# Patient Record
Sex: Female | Born: 1985 | Race: White | Hispanic: No | Marital: Single | Smoking: Never smoker
Health system: Southern US, Community
[De-identification: ages and names within clinical notes are randomized; demographics above are authoritative.]

## PROBLEM LIST (undated history)

## (undated) DIAGNOSIS — R0789 Other chest pain: Secondary | ICD-10-CM

## (undated) DIAGNOSIS — G43909 Migraine, unspecified, not intractable, without status migrainosus: Secondary | ICD-10-CM

## (undated) DIAGNOSIS — K219 Gastro-esophageal reflux disease without esophagitis: Secondary | ICD-10-CM

## (undated) HISTORY — DX: Migraine, unspecified, not intractable, without status migrainosus: G43.909

---

## 2010-06-15 ENCOUNTER — Ambulatory Visit: Payer: Self-pay | Admitting: Family Medicine

## 2011-04-16 ENCOUNTER — Ambulatory Visit: Payer: Self-pay

## 2011-08-07 ENCOUNTER — Emergency Department: Payer: Self-pay | Admitting: Emergency Medicine

## 2011-09-02 ENCOUNTER — Emergency Department: Payer: Self-pay | Admitting: Emergency Medicine

## 2012-03-18 ENCOUNTER — Ambulatory Visit: Payer: Self-pay | Admitting: Gastroenterology

## 2012-03-19 LAB — PATHOLOGY REPORT

## 2012-04-05 ENCOUNTER — Ambulatory Visit: Payer: Self-pay | Admitting: Internal Medicine

## 2012-08-17 ENCOUNTER — Ambulatory Visit: Payer: Self-pay | Admitting: Emergency Medicine

## 2012-08-17 LAB — COMPREHENSIVE METABOLIC PANEL
Albumin: 3.8 g/dL (ref 3.4–5.0)
Alkaline Phosphatase: 67 U/L (ref 50–136)
Anion Gap: 12 (ref 7–16)
BUN: 14 mg/dL (ref 7–18)
Bilirubin,Total: 0.5 mg/dL (ref 0.2–1.0)
Calcium, Total: 9 mg/dL (ref 8.5–10.1)
Chloride: 107 mmol/L (ref 98–107)
Co2: 21 mmol/L (ref 21–32)
Creatinine: 0.74 mg/dL (ref 0.60–1.30)
EGFR (African American): >60
EGFR (Non-African Amer.): >60
Glucose: 97 mg/dL (ref 65–99)
Osmolality: 280 (ref 275–301)
Potassium: 3.9 mmol/L (ref 3.5–5.1)
SGOT(AST): 21 U/L (ref 15–37)
SGPT (ALT): 20 U/L (ref 12–78)
Sodium: 140 mmol/L (ref 136–145)
Total Protein: 7.4 g/dL (ref 6.4–8.2)

## 2012-08-17 LAB — CBC WITH DIFFERENTIAL/PLATELET
Basophil #: 0.1 10*3/uL (ref 0.0–0.1)
Basophil %: 1.1 %
Eosinophil #: 0.1 10*3/uL (ref 0.0–0.7)
Eosinophil %: 1.7 %
HCT: 37.2 % (ref 35.0–47.0)
HGB: 12.6 g/dL (ref 12.0–16.0)
Lymphocyte #: 2.2 10*3/uL (ref 1.0–3.6)
Lymphocyte %: 35.8 %
MCH: 29.3 pg (ref 26.0–34.0)
MCHC: 33.8 g/dL (ref 32.0–36.0)
MCV: 87 fL (ref 80–100)
Monocyte #: 0.5 x10 3/mm (ref 0.2–0.9)
Monocyte %: 8 %
Neutrophil #: 3.2 10*3/uL (ref 1.4–6.5)
Neutrophil %: 53.4 %
Platelet: 303 10*3/uL (ref 150–440)
RBC: 4.3 10*6/uL (ref 3.80–5.20)
RDW: 12.8 % (ref 11.5–14.5)
WBC: 6.1 10*3/uL (ref 3.6–11.0)

## 2012-08-27 ENCOUNTER — Ambulatory Visit: Payer: Self-pay | Admitting: Medical

## 2012-08-27 LAB — PREGNANCY, URINE: Pregnancy Test, Urine: NEGATIVE m[IU]/mL

## 2012-12-05 ENCOUNTER — Ambulatory Visit: Payer: Self-pay | Admitting: Family Medicine

## 2012-12-05 LAB — URINALYSIS, COMPLETE
Glucose,UR: NEGATIVE mg/dL (ref 0–75)
Ph: 6 (ref 4.5–8.0)
Specific Gravity: >=1.03 (ref 1.003–1.030)

## 2013-08-09 ENCOUNTER — Encounter: Payer: Self-pay | Admitting: Podiatry

## 2013-08-09 ENCOUNTER — Ambulatory Visit (INDEPENDENT_AMBULATORY_CARE_PROVIDER_SITE_OTHER): Payer: BC Managed Care – PPO | Admitting: Podiatry

## 2013-08-09 VITALS — BP 124/68 | HR 82 | Resp 16 | Ht 64.0 in | Wt 276.0 lb

## 2013-08-09 DIAGNOSIS — M722 Plantar fascial fibromatosis: Secondary | ICD-10-CM

## 2013-08-09 MED ORDER — METHYLPREDNISOLONE (PAK) 4 MG PO TABS: ORAL_TABLET | ORAL | Status: DC

## 2013-08-09 MED ORDER — DICLOFENAC SODIUM 75 MG PO TBEC: 75.0000 mg | DELAYED_RELEASE_TABLET | Freq: Two times a day (BID) | ORAL | Status: DC

## 2013-08-09 NOTE — Progress Notes (Signed)
N PAIN L RIGHT HEEL/FOOT D 2-3 WEEKS O SLOWLY C SAME A STANDING T EXERCISES, NITE SPLINT

## 2013-08-09 NOTE — Progress Notes (Signed)
Emily Norris is a 27 year old white female with a chief complaint of pain over the last 2-3 weeks of a right heel slowly getting worse standing really agitate set and she's currently working in retail.  Objective: I have reviewed her past medical history medications and allergies. Pulses are strongly palpable bilateral lower extremity. She has severe pain on palpation medial continued tubercle of the right heel.  Assessment: Plantar fasciitis right.  Plan: Medrol Dosepak to be followed by Mobic injected the right heel today dispensed a night splint discussed appropriate shoe gear stretching exercises and ice therapy instructions were given followup with her one month.

## 2013-09-02 HISTORY — PX: PLANTAR FASCIA SURGERY: SHX746

## 2013-09-06 ENCOUNTER — Encounter: Payer: Self-pay | Admitting: Podiatry

## 2013-09-06 ENCOUNTER — Ambulatory Visit (INDEPENDENT_AMBULATORY_CARE_PROVIDER_SITE_OTHER): Payer: BC Managed Care – PPO | Admitting: Podiatry

## 2013-09-06 VITALS — BP 118/90 | HR 79 | Resp 16 | Ht 64.0 in | Wt 286.0 lb

## 2013-09-06 DIAGNOSIS — M722 Plantar fascial fibromatosis: Secondary | ICD-10-CM

## 2013-09-06 MED ORDER — IBUPROFEN 600 MG PO TABS: 600.0000 mg | ORAL_TABLET | Freq: Three times a day (TID) | ORAL | Status: DC

## 2013-09-06 NOTE — Progress Notes (Signed)
She states that the right heel is approximately 50% better. She was unable to take the Mobic due to stomach upset.  Objective: Vital signs are stable she is alert and oriented x3. She has mild tenderness on palpation of the medial calcaneal tubercle of the right foot. It is not warm to the touch.  Assessment: Plantar fasciitis.  Plan: At this point we are going to continue conservative therapies. I wrote a prescription for Motrin 600 mg 1 by mouth 3 times a day with food. She's going continue all other conservative therapies and I will followup with her in one month should she need me. Orthotics weeks and at that time if necessary

## 2013-10-04 ENCOUNTER — Ambulatory Visit (INDEPENDENT_AMBULATORY_CARE_PROVIDER_SITE_OTHER): Payer: BC Managed Care – PPO | Admitting: Podiatry

## 2013-10-04 ENCOUNTER — Encounter: Payer: Self-pay | Admitting: Podiatry

## 2013-10-04 VITALS — BP 80/49 | HR 75 | Resp 16

## 2013-10-04 DIAGNOSIS — M722 Plantar fascial fibromatosis: Secondary | ICD-10-CM

## 2013-10-04 NOTE — Progress Notes (Signed)
Right heel , i think its actually worse.  The ibuprofen is working . It does not mess up my stomach.  Objective: Vital signs are stable she is alert and oriented x3. She has pain on palpation medial continued tubercle of her right heel.  Assessment: Plantar fasciitis right.  Plan: She was scanned for a pair orthotics today. I reinjected the right heel with Kenalog and dexamethasone. I will followup with her once her orthotics come in. She will continue all other conservative therapies.

## 2013-10-15 ENCOUNTER — Encounter: Payer: Self-pay | Admitting: Podiatry

## 2013-10-15 NOTE — Progress Notes (Signed)
SENT PT POSTCARD LETTING HER KNOW ORTHOTICS ARE HERE. 

## 2013-11-16 IMAGING — CT CT HEAD WITHOUT CONTRAST
1 series · 16 of 30 positions shown, 20 images · non-contrast
Comparison: none

REASON FOR EXAM: Call Report [id number] Dizziness Rt Side Numbness
COMMENTS:

PROCEDURE:     DIGNA - MESFIN GALEANA WITHOUT CONTRAST  - August 27, 2012  [DATE]
RESULT:     Comparison:  None
TECHNIQUE: Multiple axial images from the foramen magnum to the vertex were
obtained without IV contrast.

[Series 2: soft tissue · axial · 0.41mm/px · z∈[-125,+10]mm · 16 of 31 slices shown, 20 images]
[im 2/31  brain]
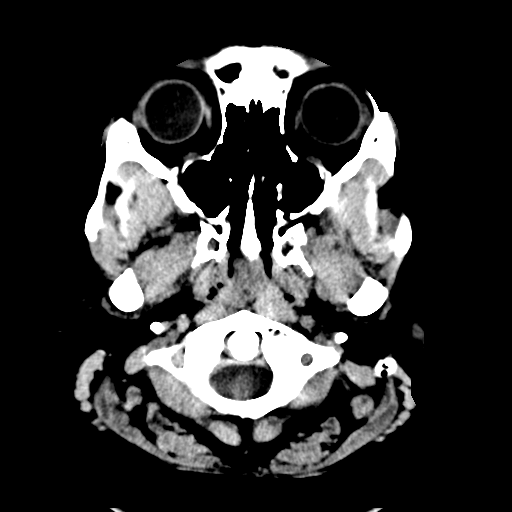
[im 2/31  bone]
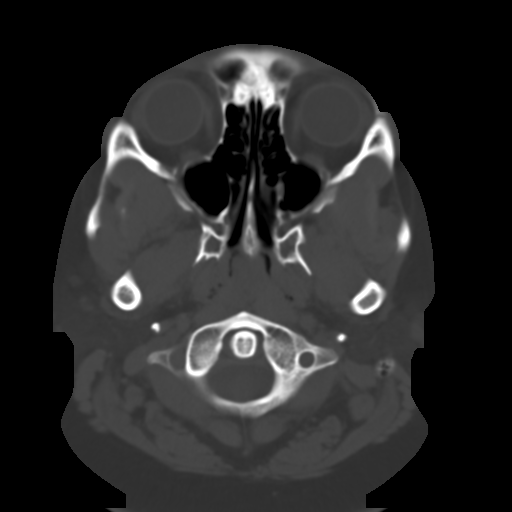
[im 4/31  brain]
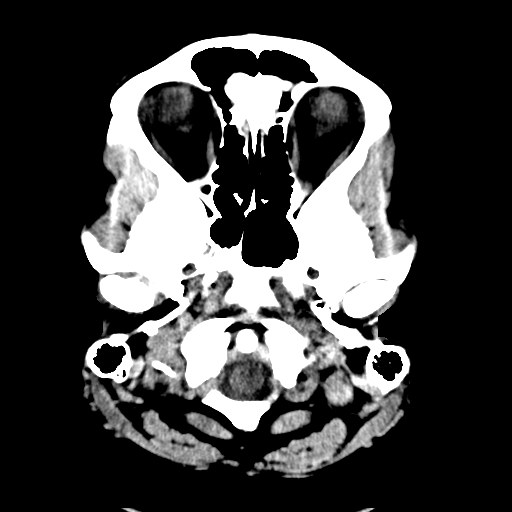
[im 6/31  brain]
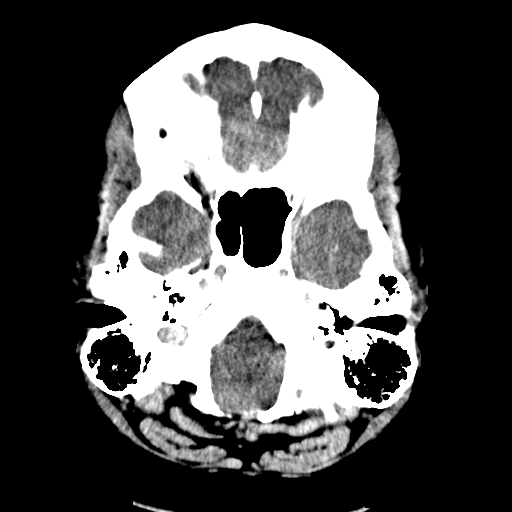
[im 8/31  brain]
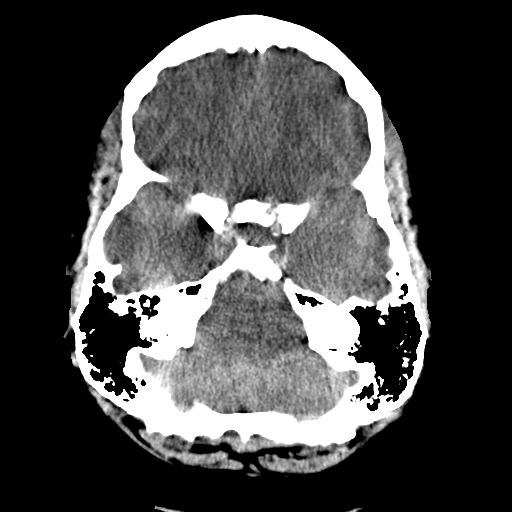
[im 9/31  brain]
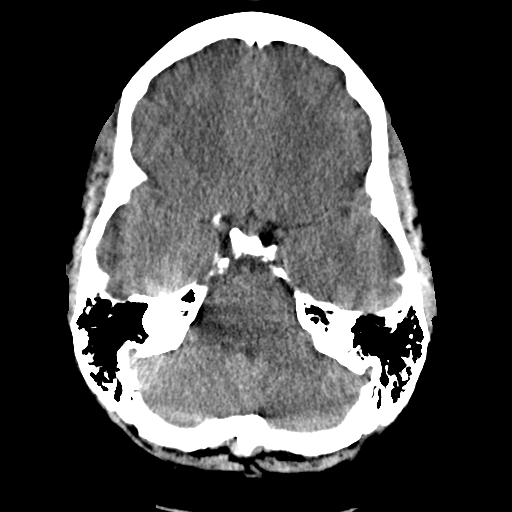
[im 9/31  bone]
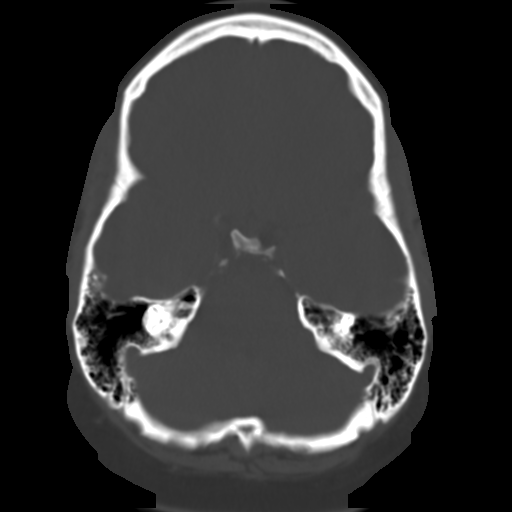
[im 11/31  brain]
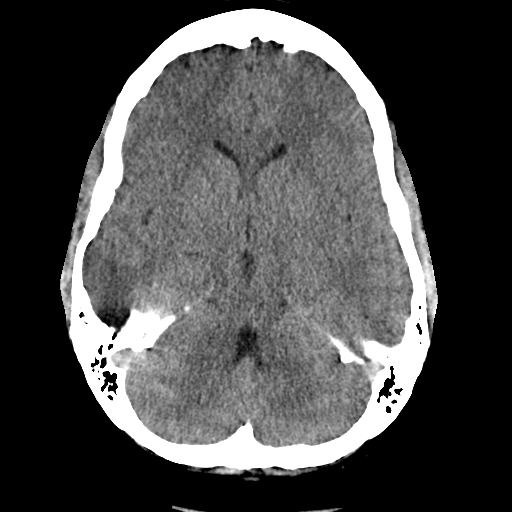
[im 13/31  brain]
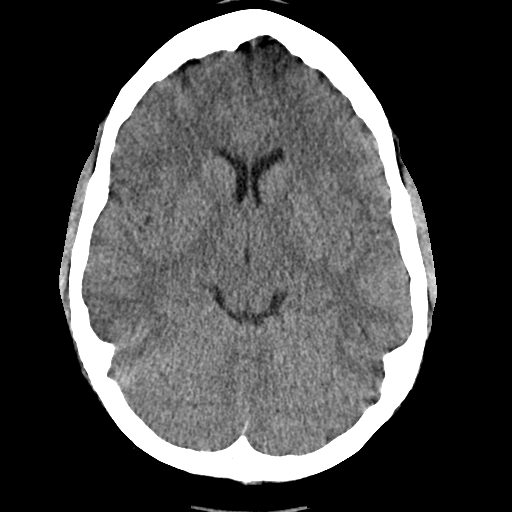
[im 15/31  brain]
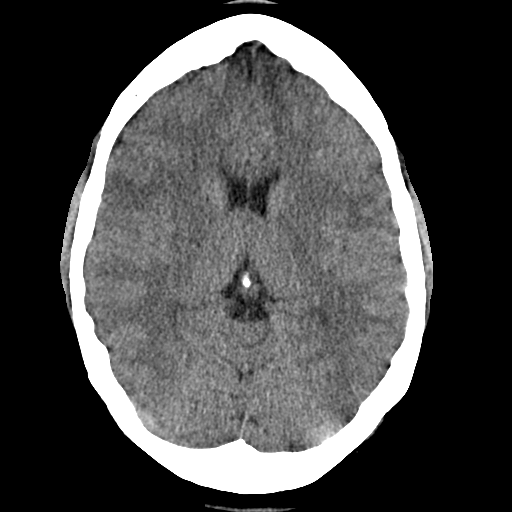
[im 16/31  brain]
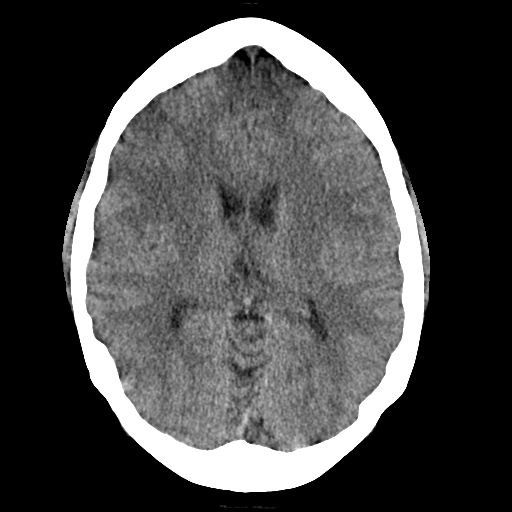
[im 16/31  bone]
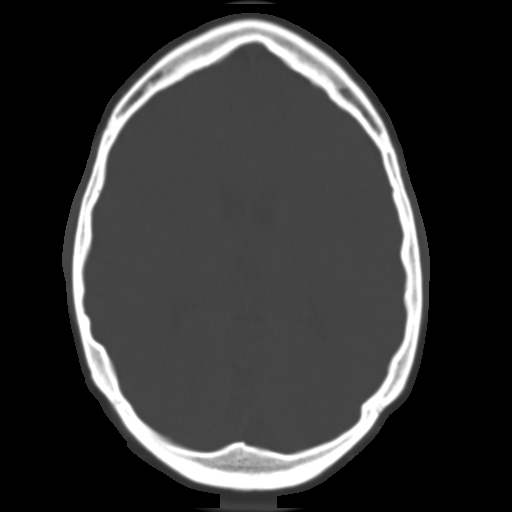
[im 18/31  brain]
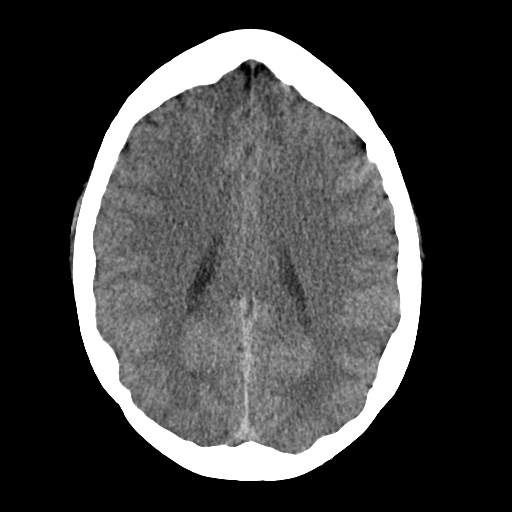
[im 20/31  brain]
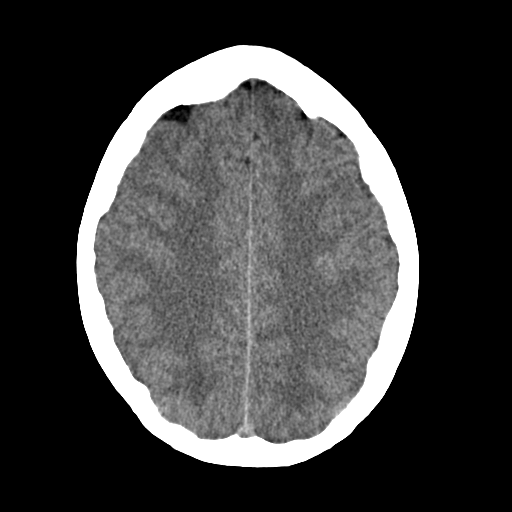
[im 22/31  brain]
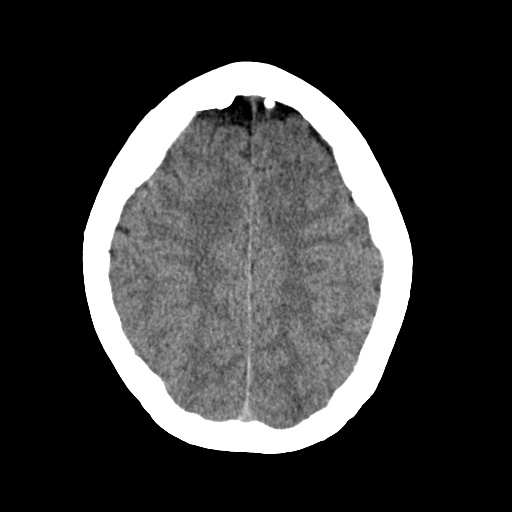
[im 23/31  brain]
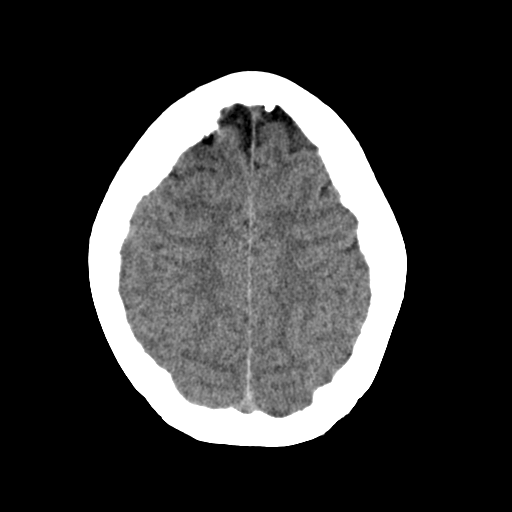
[im 23/31  bone]
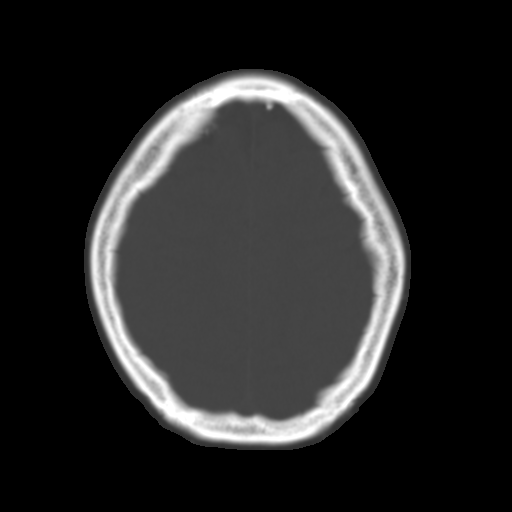
[im 25/31  brain]
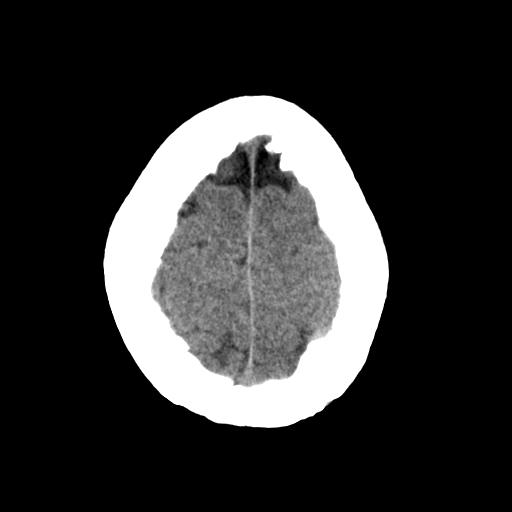
[im 27/31  brain]
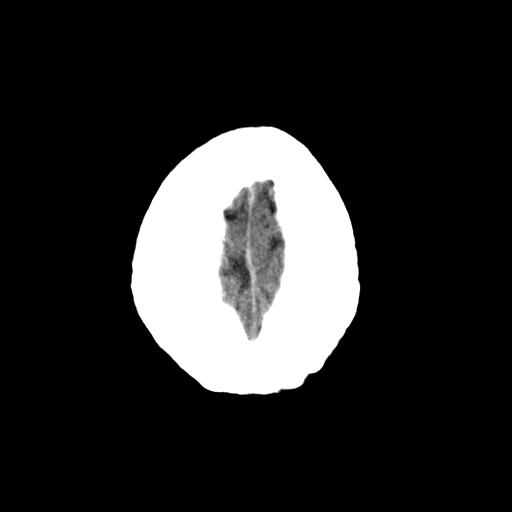
[im 29/31  brain]
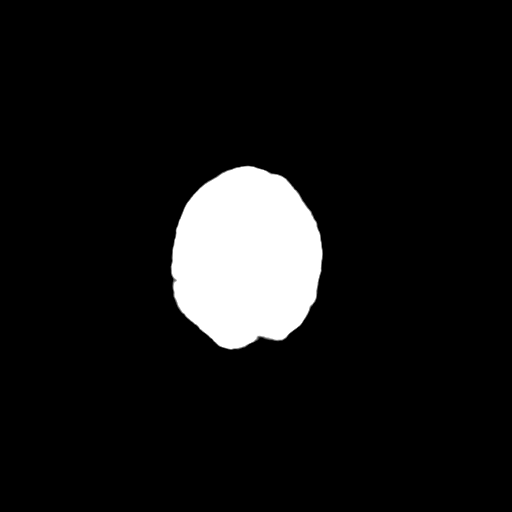

[16 of 30 positions shown; findings below may reference images not displayed]

FINDINGS: There is no evidence of mass effect, midline shift, or extra-axial fluid
collections.  There is no evidence of a space-occupying lesion or
intracranial hemorrhage. There is no evidence of a cortical-based area of
acute infarction.

The ventricles and sulci are appropriate for the patient's age. The basal
cisterns are patent.

Visualized portions of the orbits are unremarkable. The visualized portions
of the paranasal sinuses and mastoid air cells are unremarkable.

The osseous structures are unremarkable.
IMPRESSION: No acute intracranial process.

[REDACTED]

## 2013-12-20 ENCOUNTER — Ambulatory Visit (INDEPENDENT_AMBULATORY_CARE_PROVIDER_SITE_OTHER): Payer: BC Managed Care – PPO | Admitting: Podiatry

## 2013-12-20 VITALS — Resp 16 | Ht 64.0 in | Wt 290.0 lb

## 2013-12-20 DIAGNOSIS — M722 Plantar fascial fibromatosis: Secondary | ICD-10-CM

## 2013-12-20 NOTE — Progress Notes (Signed)
She presents today to pick up orthotics.  Objective: Vital signs are stable she is alert and oriented x3. She has pain on palpation medial calcaneal tubercle of the right heel which appears to be getting worse rather than better.  Assessment: Plantar fasciitis of the right heel.  Plan: Injected the right heel once again today which I would normally not have done with exception that she is getting up her orthotics. She will continue her oral anti-inflammatories and I will followup with her in 3-4 weeks. An MRI may become necessary.

## 2013-12-20 NOTE — Patient Instructions (Signed)

## 2013-12-22 ENCOUNTER — Encounter: Payer: Self-pay | Admitting: Podiatry

## 2014-01-17 ENCOUNTER — Ambulatory Visit: Payer: BC Managed Care – PPO | Admitting: Podiatry

## 2014-02-14 ENCOUNTER — Encounter: Payer: Self-pay | Admitting: Podiatry

## 2014-02-14 ENCOUNTER — Ambulatory Visit (INDEPENDENT_AMBULATORY_CARE_PROVIDER_SITE_OTHER): Payer: BC Managed Care – PPO | Admitting: Podiatry

## 2014-02-14 DIAGNOSIS — M722 Plantar fascial fibromatosis: Secondary | ICD-10-CM

## 2014-02-14 NOTE — Progress Notes (Signed)
She presents today one month after having pick up orthotics complaining that orthotics were only for about 3 weeks and her foot pain come back. She has pain right ear a she points to the medial aspect of the right foot.  Objective: Pulses are strongly palpable bilateral. She has pain on palpation medial calcaneal tubercle of the right heel.  Assessment: Plantar fasciitis x1 year right foot.  Plan: At this point conservative therapies have failed surgical therapy must be considered. We consented her today for a endoscopic plantar fasciotomy of the right heel. We will over the consent form today line bylined number by number giving her ample time to ask questions she saw fit regarding these procedures I answered them to the best of my ability in layman's terms she understood it was amenable to it and signed all 3 pages of the consent form. We did discuss a possible postop complications which may include but are not limited to postop pain bleeding swelling infection recurrence need for further surgery. She understands that is amenable to it. She was dispensed a Cam Walker and I will followup with her in a few weeks for surgery.

## 2014-02-24 ENCOUNTER — Other Ambulatory Visit: Payer: Self-pay | Admitting: Podiatry

## 2014-02-24 MED ORDER — CLINDAMYCIN HCL 150 MG PO CAPS: 150.0000 mg | ORAL_CAPSULE | Freq: Three times a day (TID) | ORAL | Status: DC

## 2014-02-24 MED ORDER — PROMETHAZINE HCL 25 MG PO TABS: 25.0000 mg | ORAL_TABLET | Freq: Three times a day (TID) | ORAL | Status: DC | PRN

## 2014-02-24 MED ORDER — OXYCODONE-ACETAMINOPHEN 10-325 MG PO TABS: 1.0000 | ORAL_TABLET | Freq: Four times a day (QID) | ORAL | Status: DC | PRN

## 2014-02-25 ENCOUNTER — Encounter: Payer: Self-pay | Admitting: Podiatry

## 2014-02-25 DIAGNOSIS — M722 Plantar fascial fibromatosis: Secondary | ICD-10-CM

## 2014-02-28 ENCOUNTER — Telehealth: Payer: Self-pay | Admitting: *Deleted

## 2014-02-28 NOTE — Telephone Encounter (Signed)
1. ENDOSCOPIC PLANTAR FASCIOTOMY RIGHT FOOT  DOB [birth year]

## 2014-02-28 NOTE — Telephone Encounter (Signed)
CALLED AND SPOKE WITH PT REGARDING SURGERY ON 6.26.15. PT STATES SHE IS ELEVATING, STAYING OFF OF FOOT, ICING, AND TAKING RX AS DIRECTED. VERIFIED PTS NEXT APPT. PT UNDERSTOOD.

## 2014-03-01 ENCOUNTER — Telehealth: Payer: Self-pay

## 2014-03-01 NOTE — Telephone Encounter (Signed)
Left message for pt to call with questions or concerns  

## 2014-03-03 ENCOUNTER — Ambulatory Visit (INDEPENDENT_AMBULATORY_CARE_PROVIDER_SITE_OTHER): Payer: BC Managed Care – PPO | Admitting: Podiatry

## 2014-03-03 VITALS — BP 116/80 | HR 72 | Temp 98.6°F | Resp 16

## 2014-03-03 DIAGNOSIS — Z9889 Other specified postprocedural states: Secondary | ICD-10-CM

## 2014-03-03 DIAGNOSIS — M722 Plantar fascial fibromatosis: Secondary | ICD-10-CM

## 2014-03-03 NOTE — Progress Notes (Signed)
She presents today one week status post endoscopic plantar fasciotomy right foot. She states that she's doing just fine. She states it is a little sore but nothing like he was in she continues to wear her Cam Dan HumphreysWalker a regular basis. She states that she had had it off some to ice the bottom part of her foot.  Objective: Vital signs are stable she is alert and oriented x3. Pulses are strongly palpable right foot. Dry sterile dressing had been removed. There is no erythema edema saline is drainage or odor. Sutures are intact medially and laterally.  Assessment: Status post endoscopic plantar fasciotomy x1 week.  Plan: Light dressing to the incision site the day and would allow her to start getting this wet and soaking in Epsom salts warm water daily. I will followup with her in one week. She's to continue the use 24 hours of the Cam Walker.

## 2014-03-10 ENCOUNTER — Encounter: Payer: Self-pay | Admitting: Podiatry

## 2014-03-10 ENCOUNTER — Ambulatory Visit (INDEPENDENT_AMBULATORY_CARE_PROVIDER_SITE_OTHER): Payer: BC Managed Care – PPO | Admitting: Podiatry

## 2014-03-10 VITALS — BP 90/52 | HR 67 | Temp 98.8°F | Resp 16

## 2014-03-10 DIAGNOSIS — Z9889 Other specified postprocedural states: Secondary | ICD-10-CM

## 2014-03-10 NOTE — Progress Notes (Signed)
She presents today 2 weeks status post endoscopic plantar fasciotomy right heel. She states that the first week was doing great the second week was excruciatingly painful along the mid arch. The heel is still doing quite well. She states that she can hardly stand on her foot for very long period of time at all.  Objective: Vital signs are stable she is alert and oriented x3. She presents her Cam Dan HumphreysWalker today with an antalgic gait. Sutures were removed. She has some tenderness on deep palpation along the medial longitudinal arch but no tenderness on palpation of the side of the fasciotomy.  Assessment: Marginal setback with stress of the intrinsic musculature to the plantar aspect of the right foot. Status post endoscopic plantar fasciotomy right foot.  Plan: Discontinue the use of the Cam Walker during the day start with a pair tennis shoes. She will continue the use of the night splint x1 month. I will followup with her in 2 weeks. We will continue to keep her out of work for the next week or 2.

## 2014-03-14 DIAGNOSIS — M79609 Pain in unspecified limb: Secondary | ICD-10-CM

## 2014-03-17 ENCOUNTER — Encounter: Payer: Self-pay | Admitting: Podiatry

## 2014-03-17 ENCOUNTER — Ambulatory Visit (INDEPENDENT_AMBULATORY_CARE_PROVIDER_SITE_OTHER): Payer: BC Managed Care – PPO | Admitting: Podiatry

## 2014-03-17 VITALS — BP 138/86 | HR 84 | Resp 12

## 2014-03-17 DIAGNOSIS — Z9889 Other specified postprocedural states: Secondary | ICD-10-CM

## 2014-03-17 NOTE — Progress Notes (Signed)
She presents today 3 weeks status post EPF right foot. She states is doing much better.  Objective: Vital signs are stable she is alert and oriented x3. No pain on palpation to the medial arch or the medial heel right.  Assessment: Well-healing surgical foot right.  Plan: Tinea all conservative therapies followup with me in 2-3 weeks.

## 2014-03-23 ENCOUNTER — Telehealth: Payer: Self-pay | Admitting: *Deleted

## 2014-03-23 NOTE — Telephone Encounter (Signed)
Pt called stating that she is in a lot of pain , told patient she can use tylenol , or ibuprofen or aleve. Keep it elevated, ice and compression and if needed to go back to her boot . Call us with any further questions

## 2014-03-25 ENCOUNTER — Encounter: Payer: Self-pay | Admitting: Podiatry

## 2014-03-25 ENCOUNTER — Ambulatory Visit (INDEPENDENT_AMBULATORY_CARE_PROVIDER_SITE_OTHER): Payer: BC Managed Care – PPO

## 2014-03-25 ENCOUNTER — Ambulatory Visit (INDEPENDENT_AMBULATORY_CARE_PROVIDER_SITE_OTHER): Payer: BC Managed Care – PPO | Admitting: Podiatry

## 2014-03-25 DIAGNOSIS — M722 Plantar fascial fibromatosis: Secondary | ICD-10-CM

## 2014-03-25 DIAGNOSIS — M7750 Other enthesopathy of unspecified foot: Secondary | ICD-10-CM

## 2014-03-25 MED ORDER — TRAMADOL HCL 50 MG PO TABS: 50.0000 mg | ORAL_TABLET | Freq: Three times a day (TID) | ORAL | Status: DC

## 2014-03-26 NOTE — Progress Notes (Signed)
Subjective:     Patient ID: Emily Norris, female   DOB: 06/12/1986, [age] y.o.   MRN: [id number]  HPI patient had surgery on her heel one month ago went back to work and is developed significant discomfort in the right arch and mild pain in the heel was swelling   Review of Systems     Objective:   Physical Exam Neurovascular status intact with muscle strength adequate range of motion within normal limits and quite a bit of discomfort in the right arch with a well-healed surgical scar on the right medial and lateral heel    Assessment:     Probably has overdone it on cement floors and created an inflammatory process with pain within the arch secondary to heel release of the plantar fascia    Plan:     Explained condition the patient and dispensed fasciall brace with instructions on usage. I also placed her on tramadol 50 mg 3 times a day and we will take her off work and Dr. Al CorpusHyatt we'll reevaluate. X-ray taken today indicated no midfoot forefoot pathology and that was reviewed with patient

## 2014-04-07 ENCOUNTER — Ambulatory Visit (INDEPENDENT_AMBULATORY_CARE_PROVIDER_SITE_OTHER): Payer: BC Managed Care – PPO | Admitting: Podiatry

## 2014-04-07 VITALS — BP 128/74 | HR 75 | Resp 16

## 2014-04-07 DIAGNOSIS — Z9889 Other specified postprocedural states: Secondary | ICD-10-CM

## 2014-04-07 DIAGNOSIS — M722 Plantar fascial fibromatosis: Secondary | ICD-10-CM

## 2014-04-07 NOTE — Progress Notes (Signed)
She presents today for followup of an endoscopic plantar fasciotomy right foot. She states it seems to be improving daily. It bothers her sometimes while she is working so she wears the boot occasionally at work.  Objective: Vital signs are stable she is alert and oriented x3 date of surgery was 02/25/2014. She has no pain on palpation medial continued tubercle of the right foot though I am able to palpate the scar from the endoscope.  Assessment: Slowly healing plantar fasciitis status post endoscopic plantar fasciotomy right foot.  Plan: Encouraged her to continue to massage the plantar aspect of the right foot. I will followup with her in one month if necessary.

## 2014-05-05 ENCOUNTER — Encounter: Payer: BC Managed Care – PPO | Admitting: Podiatry

## 2014-05-12 ENCOUNTER — Ambulatory Visit (INDEPENDENT_AMBULATORY_CARE_PROVIDER_SITE_OTHER): Payer: BC Managed Care – PPO | Admitting: Podiatry

## 2014-05-12 VITALS — BP 127/85 | HR 68 | Resp 16

## 2014-05-12 DIAGNOSIS — Z9889 Other specified postprocedural states: Secondary | ICD-10-CM

## 2014-05-12 NOTE — Progress Notes (Signed)
She presents today for followup of her endoscopic plantar fasciotomy right foot date of surgery was 02/25/2014. She states that the surgical site is feeling much better and heel is greatly improved. Occasionally she can feel some material rolling underneath her heel it is really not that sore.  Objective: Vital signs are stable she is alert and oriented x3. She has no reproducible pain on palpation today. No erythema edema cellulitis drainage or odor. I am unable to palpate the medial band of the plantar fascia.  Assessment: Well-healing endoscopic plantar fasciotomy of the right foot.  Plan: Continue all conservative therapies and I will followup with her as needed.

## 2015-03-15 DIAGNOSIS — F419 Anxiety disorder, unspecified: Secondary | ICD-10-CM | POA: Insufficient documentation

## 2015-09-21 DIAGNOSIS — R200 Anesthesia of skin: Secondary | ICD-10-CM | POA: Insufficient documentation

## 2015-09-21 DIAGNOSIS — R202 Paresthesia of skin: Secondary | ICD-10-CM | POA: Insufficient documentation

## 2015-09-29 DIAGNOSIS — G5603 Carpal tunnel syndrome, bilateral upper limbs: Secondary | ICD-10-CM | POA: Insufficient documentation

## 2016-08-02 ENCOUNTER — Ambulatory Visit
Admission: RE | Admit: 2016-08-02 | Discharge: 2016-08-02 | Disposition: A | Discharge status: Home / Self Care | Payer: BLUE CROSS/BLUE SHIELD | Source: Ambulatory Visit | Attending: Medical Oncology | Admitting: Medical Oncology

## 2016-08-02 ENCOUNTER — Other Ambulatory Visit: Payer: Self-pay | Admitting: Medical Oncology

## 2016-08-02 DIAGNOSIS — Z789 Other specified health status: Secondary | ICD-10-CM

## 2016-08-02 DIAGNOSIS — Z7289 Other problems related to lifestyle: Secondary | ICD-10-CM

## 2016-08-02 DIAGNOSIS — R05 Cough: Secondary | ICD-10-CM | POA: Insufficient documentation

## 2016-08-02 DIAGNOSIS — R058 Other specified cough: Secondary | ICD-10-CM

## 2016-08-02 DIAGNOSIS — R509 Fever, unspecified: Secondary | ICD-10-CM

## 2016-08-02 DIAGNOSIS — Z720 Tobacco use: Secondary | ICD-10-CM | POA: Insufficient documentation

## 2016-08-09 ENCOUNTER — Other Ambulatory Visit: Payer: Self-pay | Admitting: Family Medicine

## 2016-08-09 DIAGNOSIS — M545 Low back pain: Secondary | ICD-10-CM

## 2017-02-26 DIAGNOSIS — R0789 Other chest pain: Secondary | ICD-10-CM

## 2017-02-26 HISTORY — DX: Other chest pain: R07.89

## 2017-02-27 ENCOUNTER — Encounter: Payer: Self-pay | Admitting: *Deleted

## 2017-02-27 ENCOUNTER — Encounter
Admission: RE | Admit: 2017-02-27 | Discharge: 2017-02-27 | Disposition: A | Discharge status: Home / Self Care | Payer: BLUE CROSS/BLUE SHIELD | Source: Ambulatory Visit | Attending: Orthopedic Surgery | Admitting: Orthopedic Surgery

## 2017-02-27 DIAGNOSIS — R0789 Other chest pain: Secondary | ICD-10-CM | POA: Insufficient documentation

## 2017-02-27 DIAGNOSIS — G5603 Carpal tunnel syndrome, bilateral upper limbs: Secondary | ICD-10-CM | POA: Diagnosis not present

## 2017-02-27 HISTORY — DX: Gastro-esophageal reflux disease without esophagitis: K21.9

## 2017-02-27 HISTORY — DX: Morbid (severe) obesity due to excess calories: E66.01

## 2017-02-27 HISTORY — DX: Other chest pain: R07.89

## 2017-02-27 LAB — BASIC METABOLIC PANEL
ANION GAP: 8 (ref 5–15)
BUN: 12 mg/dL (ref 6–20)
CHLORIDE: 108 mmol/L (ref 101–111)
CO2: 21 mmol/L — ABNORMAL LOW (ref 22–32)
Calcium: 9.2 mg/dL (ref 8.9–10.3)
Creatinine, Ser: 0.79 mg/dL (ref 0.44–1.00)
GFR calc Af Amer: >60 mL/min (ref >60)
GFR calc non Af Amer: >60 mL/min (ref >60)
Glucose, Bld: 123 mg/dL — ABNORMAL HIGH (ref 65–99)
POTASSIUM: 3.6 mmol/L (ref 3.5–5.1)
SODIUM: 137 mmol/L (ref 135–145)

## 2017-02-27 LAB — CBC
HCT: 37.5 % (ref 35.0–47.0)
HEMOGLOBIN: 12.9 g/dL (ref 12.0–16.0)
MCH: 28.9 pg (ref 26.0–34.0)
MCHC: 34.4 g/dL (ref 32.0–36.0)
MCV: 84 fL (ref 80.0–100.0)
PLATELETS: 415 10*3/uL (ref 150–440)
RBC: 4.47 MIL/uL (ref 3.80–5.20)
RDW: 13.2 % (ref 11.5–14.5)
WBC: 8.2 10*3/uL (ref 3.6–11.0)

## 2017-02-27 NOTE — Patient Instructions (Signed)
  Your procedure is scheduled on: 03-06-17 THURSDAY Report to Same Day Surgery 2nd floor medical mall [facility](Medical Mall Entrance-take elevator on left to 2nd floor.  Check in with surgery information desk.) To find out your arrival time please call [phone] between 1PM - 3PM on 03-04-17 TUESDAY  Remember: Instructions that are not followed completely may result in serious medical risk, up to and including death, or upon the discretion of your surgeon and anesthesiologist your surgery may need to be rescheduled.    _x___ 1. Do not eat food or drink liquids after midnight. No gum chewing or hard candies.     __x__ 2. No Alcohol for 24 hours before or after surgery.   __x__3. No Smoking for 24 prior to surgery.   ____  4. Bring all medications with you on the day of surgery if instructed.    __x__ 5. Notify your doctor if there is any change in your medical condition     (cold, fever, infections).     Do not wear jewelry, make-up, hairpins, clips or nail polish.  Do not wear lotions, powders, or perfumes. You may wear deodorant.  Do not shave 48 hours prior to surgery. Men may shave face and neck.  Do not bring valuables to the hospital.    [facility] is not responsible for any belongings or valuables.               Contacts, dentures or bridgework may not be worn into surgery.  Leave your suitcase in the car. After surgery it may be brought to your room.  For patients admitted to the hospital, discharge time is determined by your treatment team.   Patients discharged the day of surgery will not be allowed to drive home.  You will need someone to drive you home and stay with you the night of your procedure.    Please read over the following fact sheets that you were given:     _x___ TAKE THE FOLLOWING MEDICATIONS THE MORNING OF SURGERY WITH A SMALL SIP OF WATER. These include:  1. ALLEGRA  2. WELLBUTRIN  3. CAMRESE  4.  5.  6.  ____Fleets enema or Magnesium Citrate as  directed.   _x___ Use CHG Soap or sage wipes as directed on instruction sheet   ____ Use inhalers on the day of surgery and bring to hospital day of surgery  ____ Stop Metformin and Janumet 2 days prior to surgery.    ____ Take 1/2 of usual insulin dose the night before surgery and none on the morning surgery.   ____ Follow recommendations from Cardiologist, Pulmonologist or PCP regarding stopping Aspirin, Coumadin, Pllavix ,Eliquis, Effient, or Pradaxa, and Pletal.  X____Stop Anti-inflammatories such as Advil, Aleve, Ibuprofen, Motrin, Naproxen, Naprosyn, Goodies powders or aspirin products NOW-OK to take Tylenol    ____ Stop supplements until after surgery.     ____ Bring C-Pap to the hospital.

## 2017-02-28 NOTE — Pre-Procedure Instructions (Signed)
CALLED [facility] TO SEE IF THEY HAD RECEIVED CLEARANCE-SPOKE WITH NURSE WHO GAVE ME ANOTHER FAX #[phone] EVEN THOUGH I DID RECEIVE FAX CONFIRMATION FROM OTHER FAX #-  FAXED TO NEW # AND IT STATED THAT FAX WENT THRU-NURSE SAID TO CALL BACK Monday TO MAKE SURE THEY RECEIVED THIS

## 2017-02-28 NOTE — Pre-Procedure Instructions (Signed)
Called Dr Randa NgoPiscitello yesterday afternoon regarding recent chest pressure and weakness to arms that pt had recently-  Mid sternal chest pressure- Medical Clearance needed-I spoke with Baird Lyonsasey this morning and notified her of the clearance.  I also faxed clearance request to both offices.

## 2017-03-04 NOTE — Pre-Procedure Instructions (Signed)
PT SAW PCP AT [facility] YESTERDAY AND THEY FELT PT NEEDED CARDIAC CLEARANCE  FOR HER RECENT CHEST PRESSURE. CALLED OVER TO [facility] AND SPOKE WITH BETSY AND PT  IS CURRENTLY BEING SEEN BY DR CALLWOOD RIGHT NOW.  WILL CHECK ON THE STATUS OF CLEARANCE LATER

## 2017-03-04 NOTE — Pre-Procedure Instructions (Signed)
RECEIVED CARDIAC CLEARANCE FROM DR CALLWOOD-ON CHART-PT LOW RISK

## 2017-03-04 NOTE — Pre-Procedure Instructions (Signed)
Dione Housekeeper, MD - 03/03/2017 3:00 PM EDT Formatting of this note may be different from the original. Emily Norris is a 31 y.o. female who presents for a clearance for surgery. She is scheduled for carpal tunnel surgery on Thursday 2 days from now. The patient so anesthesiology and that she was told that she needed to be cleared because she was experiencing chest tightness. The patient reports the night before the visit with the anesthesiologist she claims to have had a drink with aspertame. She was unaware of that content. She says after drinking this, her pressure began in the chest.At the pre op with anesthesiology there was an EKG done which did not show any concerns. Despite of this the anesthesiologist wants her to be cleared for chest pressure. Today she denies any pain or pressure in the chest. She believes that the pressure in her chest was caused by a drink that she did not know had aspartame in it on the night before.   Active Ambulatory Problems  Diagnosis Date Noted  . Migraines 04/19/2011  . Obesity, unspecified  . Back pain, unspecified 06/18/2012  . Depression, unspecified 03/15/2015  . Anxiety, unspecified 03/15/2015  . Numbness and tingling 09/21/2015  . Bilateral carpal tunnel syndrome 09/29/2015  . Nicotine vapor product user 08/02/2016   Resolved Ambulatory Problems  Diagnosis Date Noted  . No Resolved Ambulatory Problems   Past Medical History:  Diagnosis Date  . Migraines  . Obesity, unspecified   Past Medical History:  Diagnosis Date  . Migraines  . Obesity, unspecified   Allergies  Allergen Reactions  . Sulfa (Sulfonamide Antibiotics) Anaphylaxis  . Diclofenac Diarrhea  . Penicillins Rash  . Sumatriptan Nausea And Vomiting  . Sulfasalazine Rash and Swelling   Prior to Admission medications  Medication Sig Taking? Last Dose  buPROPion (WELLBUTRIN XL) 300 MG XL tablet Take 1 tablet (300 mg total) by mouth once daily. Yes Taking  fexofenadine  (ALLEGRA) 180 MG tablet Take by mouth. Yes  L norgest/e.estradiol-e.estrad 0.15 mg-30 mcg (84)/10 mcg (7) TAKE 1 TABLET BY MOUTH ONCE DAILY. Yes Taking   Past Surgical History:  Procedure Laterality Date  . foot surgery Right   family history includes Arthritis in her father; Coronary Artery Disease (Blocked arteries around heart) in her mother; Diabetes type II in her mother; Rheum arthritis in her father.  Social History   Social History  . Marital status: Single  Spouse name: N/A  . Number of children: N/A  . Years of education: N/A   Social History Main Topics  . Smoking status: Former Smoker  Packs/day: 0.30  Years: 3.00  Types: Cigarettes  Quit date: 03/25/2004  . Smokeless tobacco: Never Used  . Alcohol use Yes  Comment: occasional  . Drug use: No  . Sexual activity: Not Asked   Other Topics Concern  . None   Social History Narrative  Single, lives with mother. GED. Works at [employer]. Zumba or running, 30-60 minutes three times a week.   Current medications, allergies, problem list, personally reviewed on Epic today.  Review of Systems General No fever, chills or recent illness, no change in weight HEENT. No change in vision or hearing, no C/O pain or difficulty swallowing Resp. No cough or shortness of breath Cardiac. No chest pain or palpitations GI. No pain, dyspepsia or change in bowel habits GU. No dysuria, frequency or hesitancy Musculoskeletal. No joint pain or injury Neurological. No headaches, change in mental status, no loss of sensation, strength  or function  ENDOCRINE. No dry skin, excessive fatigue, polyuria, polydipsia. Integumentary. No rash, pruritus, lesions, tumors.  Objective:   Vitals:  03/03/17 1506  BP: (!) 154/94  Pulse: 82  Weight: (!) 138.3 kg (305 lb)  PainSc: 0-No pain   Body mass index is 51.54 kg/m.  General. Alert and oriented, in no acute distress SKIN. No rash, lesions, normal color, no diaphoresis HEENT: [place]/AT, clear  sclera, conjunctiva clear, canals clear, TM normal, nasal mucosa moist, clear nasal discharge, oral pharynx without lesions, no exudates NECK. Supple, no masses; no lymphadenopathy LUNGS. Respirations unlabored; clear to auscultation bilaterally, no wheezing, rales or ronchi.  CARDIOVASCULAR. Regular, rate, and rhythm without murmurs, gallops or rubs ABDOMEN. Soft; nontender; nondistended; no masses or organomegaly EXTREMITIES. No edema or cyanosis NEUROLOGIC. Normal mentation; moves all extremities well, sensation normal  Assessment / Plan:   Encounter for preoperative examination for general surgical procedure Recently during a visit with anesthesiologist previous to her surgical clearance that she reported chest pressure. The patient believes it was caused by drink the night before with aspartame. Now the pressure is not present. Because of the pressure I told the patient that she needs to see the cardiologist so they can clear her for surgery. A referral to see [facility] cardiology has been requested. We also discussed red flag symptoms if those are to happen the patient is to go to the emergency room. The patient acknowledged understanding. - Ambulatory Referral to Cardiology  As part of the visit and considering the current Body mass index is 51.54 kg/m. we discussed with the patient the importance of weight control including the reduction of portion sizes, decrease sweets, increase physical activity, etc  A copy of instructions have been given to the patient or responsible adult who demonstrated the ability to learn, asked appropriate questions, and verbalized understanding of the plan of care. There were no barriers to learning identified.  Portions of this note were generated with voice recognition software and may contain errors. This note is written in part by Clarisa Kindred RMA, in the presence of and acting as the scribe of Dr. Zada Finders .  I personally saw and evaluated the patient. I  reviewed the encounter specialist portion of this document for correct information and accuracy.       Plan of Treatment - as of this encounter  Upcoming Encounters Upcoming Encounters  Date Type Specialty Care Team Description  03/10/2017 Post Op Orthopaedics Marlena Clipper, MD  [address]  [facility]  [place], [place] [address]  [phone]  [phone] ([address])    Patience Musca, [place]  [address]  [place], [place] [address]  [phone]  [phone] (Fax)    03/21/2017 Post Op Orthopaedics Blanchard Mane [place], [place]  [address]  [facility] Bring  [place], [place] [address]  [phone]  [phone] (Fax)    05/21/2017 Office Visit Neurology Malvin Johns, Cleotis Nipper, MD  [address]  [facility]  [place], [place] [address]  [phone]  [phone] (Fax)     Scheduled Referrals Scheduled Referrals  Name Priority Associated Diagnoses Order Schedule  Ambulatory Referral to Cardiology STAT Encounter for preoperative examination for general surgical procedure  Chest pressure  Ordered: 03/03/2017   Visit Diagnoses   Diagnosis  Encounter for preoperative examination for general surgical procedure - Primary  Chest pressure  Other chest pain    Historical Medications - added in this encounter  This list may reflect changes made after this encounter.  Medication Sig. Disp. Refills Start Date End Date  fexofenadine (ALLEGRA) 180 MG tablet  Take by mouth.   01/04/2017    Images Document Information  Primary Care Provider Ernesta AmbleHeidi Marie Grandis MD (Dec. 31, 2012 - Present) [phone] (Work) [phone] (Fax) [address] [place], [place] [address]  Document Coverage Dates Jul. 02, 2018  Custodian Organization [facility] [place], [place] [address]   Encounter Providers Dione HousekeeperMario Ernesto Olmedo MD (Attending) [phone]  (Work) [phone] (Fax) [address] [place], [place] [address]   Encounter Date Jul. 02

## 2017-03-06 ENCOUNTER — Ambulatory Visit: Payer: BLUE CROSS/BLUE SHIELD | Admitting: Certified Registered"

## 2017-03-06 ENCOUNTER — Encounter
Admission: RE | Disposition: A | Discharge status: Home / Self Care | Payer: Self-pay | Source: Ambulatory Visit | Attending: Orthopedic Surgery

## 2017-03-06 ENCOUNTER — Ambulatory Visit
Admission: RE | Admit: 2017-03-06 | Discharge: 2017-03-06 | Disposition: A | Discharge status: Home / Self Care | Payer: BLUE CROSS/BLUE SHIELD | Source: Ambulatory Visit | Attending: Orthopedic Surgery | Admitting: Orthopedic Surgery

## 2017-03-06 ENCOUNTER — Encounter: Payer: Self-pay | Admitting: *Deleted

## 2017-03-06 DIAGNOSIS — K219 Gastro-esophageal reflux disease without esophagitis: Secondary | ICD-10-CM | POA: Insufficient documentation

## 2017-03-06 DIAGNOSIS — Z6841 Body Mass Index (BMI) 40.0 and over, adult: Secondary | ICD-10-CM | POA: Insufficient documentation

## 2017-03-06 DIAGNOSIS — Z79899 Other long term (current) drug therapy: Secondary | ICD-10-CM | POA: Diagnosis not present

## 2017-03-06 DIAGNOSIS — G5601 Carpal tunnel syndrome, right upper limb: Secondary | ICD-10-CM | POA: Diagnosis present

## 2017-03-06 HISTORY — PX: CARPAL TUNNEL RELEASE: SHX101

## 2017-03-06 LAB — POCT PREGNANCY, URINE: Preg Test, Ur: NEGATIVE

## 2017-03-06 SURGERY — CARPAL TUNNEL RELEASE
Anesthesia: General | Laterality: Right | Wound class: Clean

## 2017-03-06 MED ORDER — SUCCINYLCHOLINE CHLORIDE 20 MG/ML IJ SOLN
INTRAMUSCULAR | Status: DC | PRN
  Administered 2017-03-06: 120 mg via INTRAVENOUS

## 2017-03-06 MED ORDER — FENTANYL CITRATE (PF) 100 MCG/2ML IJ SOLN
INTRAMUSCULAR | Status: DC | PRN
  Administered 2017-03-06: 50 ug via INTRAVENOUS
  Administered 2017-03-06: 100 ug via INTRAVENOUS
  Administered 2017-03-06: 50 ug via INTRAVENOUS

## 2017-03-06 MED ORDER — PROPOFOL 10 MG/ML IV BOLUS
INTRAVENOUS | Status: AC
  Filled 2017-03-06: qty 20

## 2017-03-06 MED ORDER — MIDAZOLAM HCL 5 MG/5ML IJ SOLN
INTRAMUSCULAR | Status: DC | PRN
  Administered 2017-03-06: 2 mg via INTRAVENOUS

## 2017-03-06 MED ORDER — GLYCOPYRROLATE 0.2 MG/ML IJ SOLN
INTRAMUSCULAR | Status: DC | PRN
  Administered 2017-03-06: 0.2 mg via INTRAVENOUS

## 2017-03-06 MED ORDER — ONDANSETRON HCL 4 MG/2ML IJ SOLN: 4.0000 mg | Freq: Four times a day (QID) | INTRAMUSCULAR | Status: DC | PRN

## 2017-03-06 MED ORDER — FAMOTIDINE 20 MG PO TABS
ORAL_TABLET | ORAL | Status: DC
Start: 2017-03-06 — End: 2017-03-06
  Filled 2017-03-06: qty 1

## 2017-03-06 MED ORDER — FENTANYL CITRATE (PF) 100 MCG/2ML IJ SOLN
INTRAMUSCULAR | Status: AC
  Filled 2017-03-06: qty 2

## 2017-03-06 MED ORDER — HYDROCODONE-ACETAMINOPHEN 5-325 MG PO TABS: 1.0000 | ORAL_TABLET | Freq: Four times a day (QID) | ORAL | 0 refills | Status: DC | PRN

## 2017-03-06 MED ORDER — DEXAMETHASONE SODIUM PHOSPHATE 10 MG/ML IJ SOLN
INTRAMUSCULAR | Status: DC | PRN
Start: 2017-03-06 — End: 2017-03-06
  Administered 2017-03-06: 5 mg via INTRAVENOUS

## 2017-03-06 MED ORDER — MIDAZOLAM HCL 2 MG/2ML IJ SOLN
INTRAMUSCULAR | Status: AC
  Filled 2017-03-06: qty 2

## 2017-03-06 MED ORDER — ONDANSETRON HCL 4 MG/2ML IJ SOLN: 4.0000 mg | Freq: Once | INTRAMUSCULAR | Status: DC | PRN

## 2017-03-06 MED ORDER — SODIUM CHLORIDE 0.9 % IV SOLN: INTRAVENOUS | Status: DC

## 2017-03-06 MED ORDER — PROPOFOL 10 MG/ML IV BOLUS
INTRAVENOUS | Status: DC | PRN
  Administered 2017-03-06: 200 mg via INTRAVENOUS

## 2017-03-06 MED ORDER — LACTATED RINGERS IV SOLN
INTRAVENOUS | Status: DC
  Administered 2017-03-06: 11:00:00 via INTRAVENOUS

## 2017-03-06 MED ORDER — METOCLOPRAMIDE HCL 10 MG PO TABS: 5.0000 mg | ORAL_TABLET | Freq: Three times a day (TID) | ORAL | Status: DC | PRN

## 2017-03-06 MED ORDER — ONDANSETRON HCL 4 MG/2ML IJ SOLN
INTRAMUSCULAR | Status: DC | PRN
  Administered 2017-03-06: 4 mg via INTRAVENOUS

## 2017-03-06 MED ORDER — ONDANSETRON HCL 4 MG PO TABS: 4.0000 mg | ORAL_TABLET | Freq: Four times a day (QID) | ORAL | Status: DC | PRN

## 2017-03-06 MED ORDER — ONDANSETRON HCL 4 MG/2ML IJ SOLN
INTRAMUSCULAR | Status: AC
  Filled 2017-03-06: qty 2

## 2017-03-06 MED ORDER — BUPIVACAINE HCL 0.5 % IJ SOLN
INTRAMUSCULAR | Status: DC | PRN
  Administered 2017-03-06: 10 mL

## 2017-03-06 MED ORDER — GLYCOPYRROLATE 0.2 MG/ML IJ SOLN
INTRAMUSCULAR | Status: AC
  Filled 2017-03-06: qty 1

## 2017-03-06 MED ORDER — FENTANYL CITRATE (PF) 100 MCG/2ML IJ SOLN: 25.0000 ug | INTRAMUSCULAR | Status: DC | PRN

## 2017-03-06 MED ORDER — LIDOCAINE HCL (PF) 2 % IJ SOLN
INTRAMUSCULAR | Status: AC
  Filled 2017-03-06: qty 2

## 2017-03-06 MED ORDER — HYDROCODONE-ACETAMINOPHEN 5-325 MG PO TABS: 1.0000 | ORAL_TABLET | ORAL | Status: DC | PRN

## 2017-03-06 MED ORDER — FAMOTIDINE 20 MG PO TABS
20.0000 mg | ORAL_TABLET | Freq: Once | ORAL | Status: AC
  Administered 2017-03-06: 20 mg via ORAL

## 2017-03-06 MED ORDER — METOCLOPRAMIDE HCL 5 MG/ML IJ SOLN: 5.0000 mg | Freq: Three times a day (TID) | INTRAMUSCULAR | Status: DC | PRN

## 2017-03-06 SURGICAL SUPPLY — 21 items
BANDAGE ACE 3X5.8 VEL STRL LF (GAUZE/BANDAGES/DRESSINGS) ×3 IMPLANT
CANISTER SUCT 1200ML W/VALVE (MISCELLANEOUS) ×3 IMPLANT
CHLORAPREP W/TINT 26ML (MISCELLANEOUS) ×3 IMPLANT
CUFF TOURN 18 STER (MISCELLANEOUS) IMPLANT
ELECT CAUTERY NEEDLE 2.0 MIC (NEEDLE) IMPLANT
GAUZE PETRO XEROFOAM 1X8 (MISCELLANEOUS) ×3 IMPLANT
GAUZE SPONGE 4X4 12PLY STRL (GAUZE/BANDAGES/DRESSINGS) ×3 IMPLANT
GLOVE SURG SYN 9.0  PF PI (GLOVE) ×2
GLOVE SURG SYN 9.0 PF PI (GLOVE) ×1 IMPLANT
GOWN SRG 2XL LVL 4 RGLN SLV (GOWNS) ×1 IMPLANT
GOWN STRL NON-REIN 2XL LVL4 (GOWNS) ×2
GOWN STRL REUS W/ TWL LRG LVL3 (GOWN DISPOSABLE) ×1 IMPLANT
GOWN STRL REUS W/TWL LRG LVL3 (GOWN DISPOSABLE) ×2
KIT RM TURNOVER STRD PROC AR (KITS) ×3 IMPLANT
NS IRRIG 500ML POUR BTL (IV SOLUTION) ×3 IMPLANT
PACK EXTREMITY ARMC (MISCELLANEOUS) ×3 IMPLANT
PAD CAST CTTN 4X4 STRL (SOFTGOODS) ×1 IMPLANT
PADDING CAST COTTON 4X4 STRL (SOFTGOODS) ×2
SUT ETHILON 4-0 (SUTURE) ×2
SUT ETHILON 4-0 FS2 18XMFL BLK (SUTURE) ×1
SUTURE ETHLN 4-0 FS2 18XMF BLK (SUTURE) ×1 IMPLANT

## 2017-03-06 NOTE — Discharge Instructions (Addendum)
AMBULATORY SURGERY  DISCHARGE INSTRUCTIONS   1) The drugs that you were given will stay in your system until tomorrow so for the next 24 hours you should not:  A) Drive an automobile B) Make any legal decisions C) Drink any alcoholic beverage   2) You may resume regular meals tomorrow.  Today it is better to start with liquids and gradually work up to solid foods.  You may eat anything you prefer, but it is better to start with liquids, then soup and crackers, and gradually work up to solid foods.   3) Please notify your doctor immediately if you have any unusual bleeding, trouble breathing, redness and pain at the surgery site, drainage, fever, or pain not relieved by medication.    4) Additional Instructions:        Please contact your physician with any problems or Same Day Surgery at [phone], Monday through Friday 6 am to 4 pm, or [place] at [facility] Main number at [phone].    Loosen Ace wrap if fingers swell. Take pain medicine as directed. Work fingers is much as possible.

## 2017-03-06 NOTE — Anesthesia Postprocedure Evaluation (Signed)
Anesthesia Post Note  Patient: Emily Norris  Procedure(s) Performed: Procedure(s) (LRB): CARPAL TUNNEL RELEASE (Right)  Patient location during evaluation: PACU Anesthesia Type: General Level of consciousness: awake and alert and oriented Pain management: pain level controlled Vital Signs Assessment: post-procedure vital signs reviewed and stable Respiratory status: spontaneous breathing Cardiovascular status: blood pressure returned to baseline Anesthetic complications: no     Last Vitals:  Vitals:   03/06/17 1234 03/06/17 1239  BP: (!) 138/97 (!) 143/86  Pulse: 88 90  Resp: 17 (!) 21  Temp:      Last Pain:  Vitals:   03/06/17 1239  PainSc: 0-No pain                 Rahmir Beever

## 2017-03-06 NOTE — Anesthesia Procedure Notes (Addendum)
Procedure Name: Intubation Date/Time: 03/06/2017 11:54 AM Performed by: Mathews ArgyleLOGAN, Alfred Harrel Pre-anesthesia Checklist: Patient identified, Patient being monitored, Timeout performed, Emergency Drugs available and Suction available Patient Re-evaluated:Patient Re-evaluated prior to inductionOxygen Delivery Method: Circle system utilized Preoxygenation: Pre-oxygenation with 100% oxygen Intubation Type: IV induction Ventilation: Mask ventilation without difficulty Laryngoscope Size: Miller and 2 Grade View: Grade I Tube type: Oral Tube size: 7.0 mm Number of attempts: 1 Airway Equipment and Method: Stylet Placement Confirmation: ETT inserted through vocal cords under direct vision,  positive ETCO2 and breath sounds checked- equal and bilateral Secured at: 21 cm Tube secured with: Tape Dental Injury: Teeth and Oropharynx as per pre-operative assessment

## 2017-03-06 NOTE — H&P (Signed)
Reviewed paper H+P, will be scanned into chart. Patient examined No changes noted.  

## 2017-03-06 NOTE — Anesthesia Preprocedure Evaluation (Addendum)
Anesthesia Evaluation  Patient identified by MRN, date of birth, ID band Patient awake    Reviewed: Allergy & Precautions, NPO status , Patient's Chart, lab work & pertinent test results  Airway Mallampati: II  TM Distance: <3 FB     Dental  (+) Chipped   Pulmonary neg pulmonary ROS,    Pulmonary exam normal        Cardiovascular negative cardio ROS Normal cardiovascular exam     Neuro/Psych  Headaches, negative psych ROS   GI/Hepatic Neg liver ROS, GERD  Medicated and Controlled,  Endo/Other  Morbid obesity  Renal/GU negative Renal ROS  negative genitourinary   Musculoskeletal   Abdominal Normal abdominal exam  (+)   Peds negative pediatric ROS (+)  Hematology negative hematology ROS (+)   Anesthesia Other Findings   Reproductive/Obstetrics                            Anesthesia Physical Anesthesia Plan  ASA: III  Anesthesia Plan: General   Post-op Pain Management:    Induction: Intravenous, Rapid sequence and Cricoid pressure planned  PONV Risk Score and Plan: 3 and Ondansetron, Dexamethasone, Propofol, Midazolam and Treatment may vary due to age or medical condition  Airway Management Planned: Oral ETT  Additional Equipment:   Intra-op Plan:   Post-operative Plan: Extubation in OR  Informed Consent: I have reviewed the patients History and Physical, chart, labs and discussed the procedure including the risks, benefits and alternatives for the proposed anesthesia with the patient or authorized representative who has indicated his/her understanding and acceptance.   Dental advisory given  Plan Discussed with: CRNA and Surgeon  Anesthesia Plan Comments:         Anesthesia Quick Evaluation

## 2017-03-06 NOTE — Anesthesia Procedure Notes (Signed)
Performed by: Jovonni Borquez       

## 2017-03-06 NOTE — Anesthesia Post-op Follow-up Note (Cosign Needed)
Anesthesia QCDR form completed.        

## 2017-03-06 NOTE — Op Note (Signed)
03/06/2017  12:21 PM  PATIENT:  Emily Norris  31 y.o. female  PRE-OPERATIVE DIAGNOSIS:  CARPAL TUNNEL SYNDROME right  POST-OPERATIVE DIAGNOSIS:  CARPAL TUNNEL SYNDROME right  PROCEDURE:  Procedure(s): CARPAL TUNNEL RELEASE (Right)  SURGEON: Leitha SchullerMichael J Dionicio Shelnutt, MD  ASSISTANTS: None  ANESTHESIA:   general  EBL:  Total I/O In: 500 [I.V.:500] Out: -   BLOOD ADMINISTERED:none  DRAINS: none   LOCAL MEDICATIONS USED:  MARCAINE     SPECIMEN:  No Specimen  DISPOSITION OF SPECIMEN:  N/A  COUNTS:  YES  TOURNIQUET:   Total Tourniquet Time Documented: Upper Arm (Right) - 13 minutes Total: Upper Arm (Right) - 13 minutes   IMPLANTS: None  DICTATION: .Dragon Dictation patient was brought the operating room and after adequate general anesthesia was obtained the right arm was prepped and draped in sterile fashion. After patient identification and timeout procedures were completed, tourniquet was raised. A incision was made in line with ring metacarpal approximate 2 cm in length. The subcutaneous tissues tissue spread there is some aberrant muscle overlying the transverse carpal ligament which was elevated. Drilling was then opened and then a vascular hemostat placed to protect the underlying structures. Releases carried out distally until fat was noted around the nerve. Proximally releases carried out and the incision proximally was extended across the wrist flexion crease. Releases carried out subcutaneously under this more proximal incision another approximate 2 cm at which point the compression appeared to be relieved. There is good vascular blush to the nerve. So compression was more in the antebrachial fascia rather than in the carpal tunnel. The wound was irrigated and then closed after infiltration of 10 cc of half percent Sensorcaine plain. Xeroform 4 x 4 web roll and Ace wrap applied  PLAN OF CARE: Discharge to home after PACU  PATIENT DISPOSITION:  PACU - hemodynamically  stable.

## 2017-03-06 NOTE — Transfer of Care (Signed)
Immediate Anesthesia Transfer of Care Note  Patient: Emily Norris  Procedure(s) Performed: Procedure(s): CARPAL TUNNEL RELEASE (Right)  Patient Location: PACU  Anesthesia Type:General  Level of Consciousness: sedated  Airway & Oxygen Therapy: Patient Spontanous Breathing and Patient connected to face mask oxygen  Post-op Assessment: Report given to RN and Post -op Vital signs reviewed and stable  Post vital signs: Reviewed  Last Vitals:  Vitals:   03/06/17 1022 03/06/17 1224  BP: (!) 156/87 115/69  Pulse: 83 81  Resp: 18 17  Temp: 36.9 C (!) 36 C    Last Pain:  Vitals:   03/06/17 1022  PainSc: 3          Complications: No apparent anesthesia complications

## 2017-08-03 ENCOUNTER — Ambulatory Visit
Admission: EM | Admit: 2017-08-03 | Discharge: 2017-08-03 | Disposition: A | Discharge status: Home / Self Care | Payer: BLUE CROSS/BLUE SHIELD | Attending: Emergency Medicine | Admitting: Emergency Medicine

## 2017-08-03 ENCOUNTER — Other Ambulatory Visit: Payer: Self-pay

## 2017-08-03 DIAGNOSIS — M7918 Myalgia, other site: Secondary | ICD-10-CM

## 2017-08-03 DIAGNOSIS — B349 Viral infection, unspecified: Secondary | ICD-10-CM

## 2017-08-03 DIAGNOSIS — B379 Candidiasis, unspecified: Secondary | ICD-10-CM

## 2017-08-03 LAB — URINALYSIS, COMPLETE (UACMP) WITH MICROSCOPIC
BILIRUBIN URINE: NEGATIVE
GLUCOSE, UA: NEGATIVE mg/dL
Ketones, ur: NEGATIVE mg/dL
Leukocytes, UA: NEGATIVE
NITRITE: NEGATIVE
PROTEIN: NEGATIVE mg/dL
SPECIFIC GRAVITY, URINE: 1.025 (ref 1.005–1.030)
pH: 5.5 (ref 5.0–8.0)

## 2017-08-03 MED ORDER — IBUPROFEN 600 MG PO TABS: 600.0000 mg | ORAL_TABLET | Freq: Four times a day (QID) | ORAL | 0 refills | Status: DC | PRN

## 2017-08-03 MED ORDER — FLUCONAZOLE 150 MG PO TABS: 150.0000 mg | ORAL_TABLET | Freq: Once | ORAL | 1 refills | Status: AC

## 2017-08-03 MED ORDER — METHOCARBAMOL 750 MG PO TABS: 750.0000 mg | ORAL_TABLET | ORAL | 0 refills | Status: DC

## 2017-08-03 NOTE — Discharge Instructions (Signed)
600 mg of ibuprofen with 1 g of Tylenol 3-4 times a day as needed for pain.  Try the Robaxin for the muscle aches.  Take the Diflucan if you start having vaginal itching or discharge, otherwise, continue pushing fluids.  Go to the ER for the signs and symptoms we discussed.

## 2017-08-03 NOTE — ED Triage Notes (Signed)
Patient states Friday she was experiencing upper abdominal pain which moved to right lower back. She states yesterday she started having body aches and neck pain/swelling.

## 2017-08-03 NOTE — ED Provider Notes (Signed)
HPI  SUBJECTIVE:  Emily Norris is a 31 y.o. female who presents with bilateral neck soreness worse on the right than the left starting today.  She states that it "feels swollen".  She reports rhinorrhea, body aches, fatigue, mild cough.  She states that this neck pain is constant.  She denies sore throat, nasal congestion, fevers, postnasal drip.  No difficulty breathing, shortness of breath, wheezing, voice changes, drooling, trismus.  She did not sleep on her neck wrong.  No neck stiffness.  She has not tried anything for this.  Symptoms are better with lateral bending to the right and applying pressure to the area, worse with head rotation to the right.  She also reports migratory, nonradiating sharp, constant, subxiphoid abdominal pain 2-3 days ago which is now migrated to her right back.  She describes this area as achy, sore.  The abdominal pain has since resolved.  She reports some nausea, and cloudy urine.  States that her abdomen felt distended in this area at one point, but this has since resolved.  No vomiting, fevers, dysuria, urgency, frequency, odorous urine, hematuria.  No chest pain, shortness of breath.  No vaginal discharge, itching.  It is worse with moving around and bending forward, no alleviating factors.  She has not tried anything for this.  She has a past medical history of obesity, migraines, UTIs.  No history of diabetes, hypertension, GERD, mono, pyelonephritis, nephrolithiasis.  LMP: 1 month ago.  Denies possibility being pregnant.  PMD: Emily GalaGrandis, Heidi, MD   Past Medical History:  Diagnosis Date  . Chest pressure 02/26/2017   MID-STERNAL  . GERD (gastroesophageal reflux disease)    rare-tums prn  . Migraines   . Morbid obesity (HCC)     Past Surgical History:  Procedure Laterality Date  . CARPAL TUNNEL RELEASE Right 03/06/2017   Procedure: CARPAL TUNNEL RELEASE;  Surgeon: Kennedy BuckerMenz, Michael, MD;  Location: [facility] ORS;  Service: Orthopedics;  Laterality: Right;  .  PLANTAR FASCIA SURGERY Right 2015    History reviewed. No pertinent family history.  Social History   Tobacco Use  . Smoking status: Never Smoker  . Smokeless tobacco: Never Used  Substance Use Topics  . Alcohol use: Yes    Comment: rare  . Drug use: No    No current facility-administered medications for this encounter.   Current Outpatient Medications:  .  buPROPion (WELLBUTRIN XL) 300 MG 24 hr tablet, Take 1 tablet by mouth every morning., Disp: , Rfl: 11 .  Levonorgest-Eth Estrad 91-Day (CAMRESE PO), Take by mouth daily., Disp: , Rfl:  .  fluconazole (DIFLUCAN) 150 MG tablet, Take 1 tablet (150 mg total) by mouth once for 1 dose. 1 tab po x 1. May repeat in 72 hours if no improvement, Disp: 2 tablet, Rfl: 1 .  ibuprofen (ADVIL,MOTRIN) 600 MG tablet, Take 1 tablet (600 mg total) by mouth every 6 (six) hours as needed., Disp: 30 tablet, Rfl: 0 .  methocarbamol (ROBAXIN) 750 MG tablet, Take 1 tablet (750 mg total) by mouth every 4 (four) hours., Disp: 40 tablet, Rfl: 0  Allergies  Allergen Reactions  . Diclofenac Diarrhea  . Imitrex [Sumatriptan] Nausea And Vomiting  . Penicillins Other (See Comments)    UNKNOWN  . Sulfa Antibiotics Swelling and Rash     ROS  As noted in HPI.   Physical Exam  BP 139/89 (BP Location: Left Arm)   Pulse 87   Temp 98.6 F (37 C) (Oral)   Ht 5\' 5"  (  1.651 m)   Wt 300 lb (136.1 kg)   LMP 07/04/2017 (Approximate)   SpO2 100%   BMI 49.92 kg/m   Constitutional: Well developed, well nourished, no acute distress Eyes: PERRL, EOMI, conjunctiva normal bilaterally HENT: Normocephalic, atraumatic,mucus membranes moist.  Oropharynx, tonsils normal, uvula midline.  No drooling, trismus, stridor. Respiratory: Clear to auscultation bilaterally, no rales, no wheezing, no rhonchi Cardiovascular: Normal rate and rhythm, no murmurs, no gallops, no rubs GI: Soft, obese, nondistended, normal bowel sounds, mild right flank tenderness.  Minimal  suprapubic tenderness.  Negative Murphy, negative McBurney.  no rebound, no guarding Back: Mild right CVAT skin: No rash, skin intact Lymph: No appreciable cervical lymphadenopathy Neck: No appreciable swelling. musculoskeletal: Positive tenderness and tightness along the right sternocleidomastoid.  Tenderness along the right trapezius, posterior right upper ribs, and ribs along the right side of the mid axillary line. no edema, no tenderness, no deformities Neurologic: Alert & oriented x 3, CN II-XII grossly intact, no motor deficits, sensation grossly intact Psychiatric: Speech and behavior appropriate   ED Course   Medications - No data to display  Orders Placed This Encounter  Procedures  . Urinalysis, Complete w Microscopic    Standing Status:   Standing    Number of Occurrences:   1   Results for orders placed or performed during the hospital encounter of 08/03/17 (from the past 24 hour(s))  Urinalysis, Complete w Microscopic     Status: Abnormal   Collection Time: 08/03/17  9:03 AM  Result Value Ref Range   Color, Urine YELLOW YELLOW   APPearance HAZY (A) CLEAR   Specific Gravity, Urine 1.025 1.005 - 1.030   pH 5.5 5.0 - 8.0   Glucose, UA NEGATIVE NEGATIVE mg/dL   Hgb urine dipstick SMALL (A) NEGATIVE   Bilirubin Urine NEGATIVE NEGATIVE   Ketones, ur NEGATIVE NEGATIVE mg/dL   Protein, ur NEGATIVE NEGATIVE mg/dL   Nitrite NEGATIVE NEGATIVE   Leukocytes, UA NEGATIVE NEGATIVE   Squamous Epithelial / LPF 6-30 (A) NONE SEEN   WBC, UA 0-5 0 - 5 WBC/hpf   RBC / HPF 0-5 0 - 5 RBC/hpf   Bacteria, UA FEW (A) NONE SEEN   Mucus PRESENT    Budding Yeast PRESENT    No results found.  ED Clinical Impression  Musculoskeletal pain  Viral syndrome   ED Assessment/Plan  We will check a urine because of the abdominal pain and cloudy urine.  She does have questionable right CVA and flank tenderness.  Does also have tenderness along the neck muscles, trapezius and the right  side of her body.  She is afebrile, satting 100% on room air, is normal, has not taken any antipyretic in the past 6-8 hours.  She has no evidence of pharyngitis.  Doubt epiglottitis, retropharyngeal abscess, peritonsillar abscess.  This seems to be musculoskeletal, this could also be viral syndrome.  Will send home with ibuprofen 600 mg with 1 g of Tylenol 3-4 times a day, Robaxin, she will need to follow-up with a primary care physician in several days, and go immediately to the ER if she gets worse.  Urine has yeast, no UTI.  It is a contaminated sample.  She denies any vaginal itching or discharge, but will send home with Diflucan in case she does start experiencing these symptoms.  Discussed labs, MDM, plan and followup with patient.  Discussed sn/sx that should prompt return to the ED. patient agrees with plan.   Meds ordered this encounter  Medications  .  fluconazole (DIFLUCAN) 150 MG tablet    Sig: Take 1 tablet (150 mg total) by mouth once for 1 dose. 1 tab po x 1. May repeat in 72 hours if no improvement    Dispense:  2 tablet    Refill:  1  . ibuprofen (ADVIL,MOTRIN) 600 MG tablet    Sig: Take 1 tablet (600 mg total) by mouth every 6 (six) hours as needed.    Dispense:  30 tablet    Refill:  0  . methocarbamol (ROBAXIN) 750 MG tablet    Sig: Take 1 tablet (750 mg total) by mouth every 4 (four) hours.    Dispense:  40 tablet    Refill:  0    *This clinic note was created using Scientist, clinical (histocompatibility and immunogenetics). Therefore, there may be occasional mistakes despite careful proofreading.  ?   Domenick Gong, MD 08/04/17 [id number]

## 2017-10-22 IMAGING — CR DG CHEST 2V
2 series · 2 of 2 positions shown · non-contrast
Comparison: None.

CLINICAL DATA: Cough with fever

EXAM:
CHEST  2 VIEW

[chest pa]
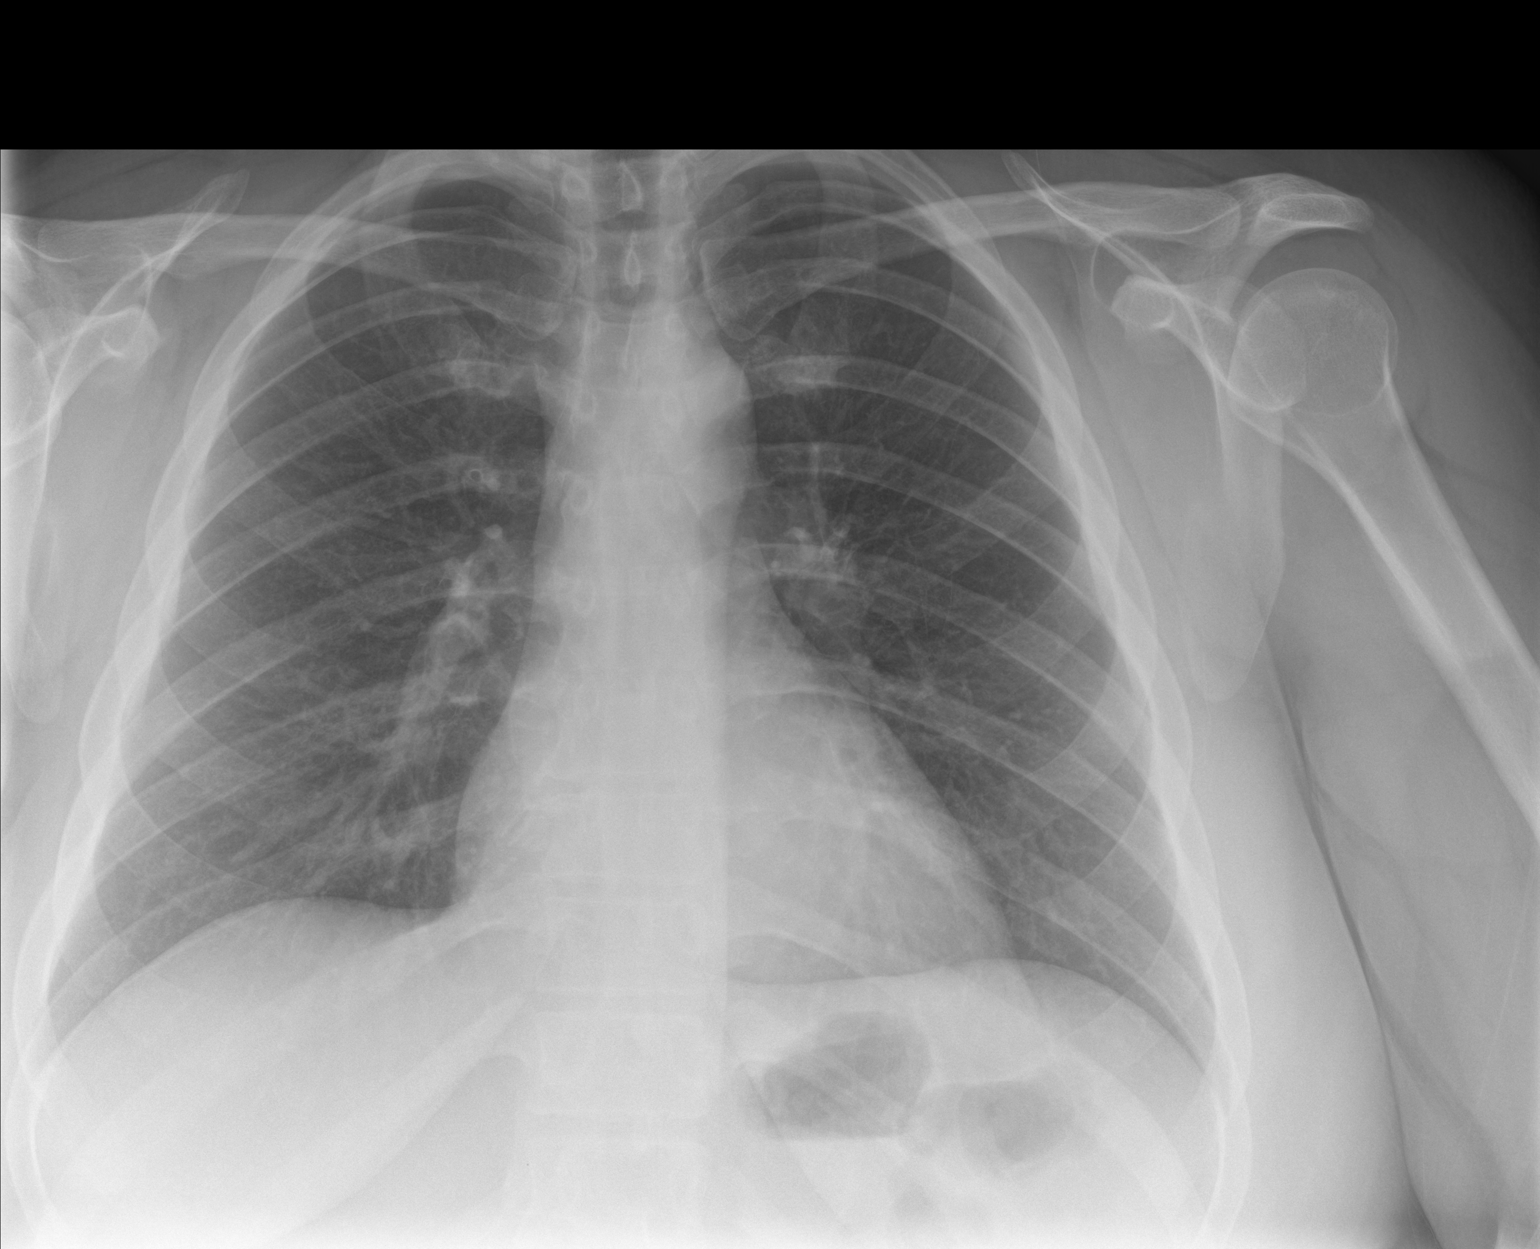

[chest lat]
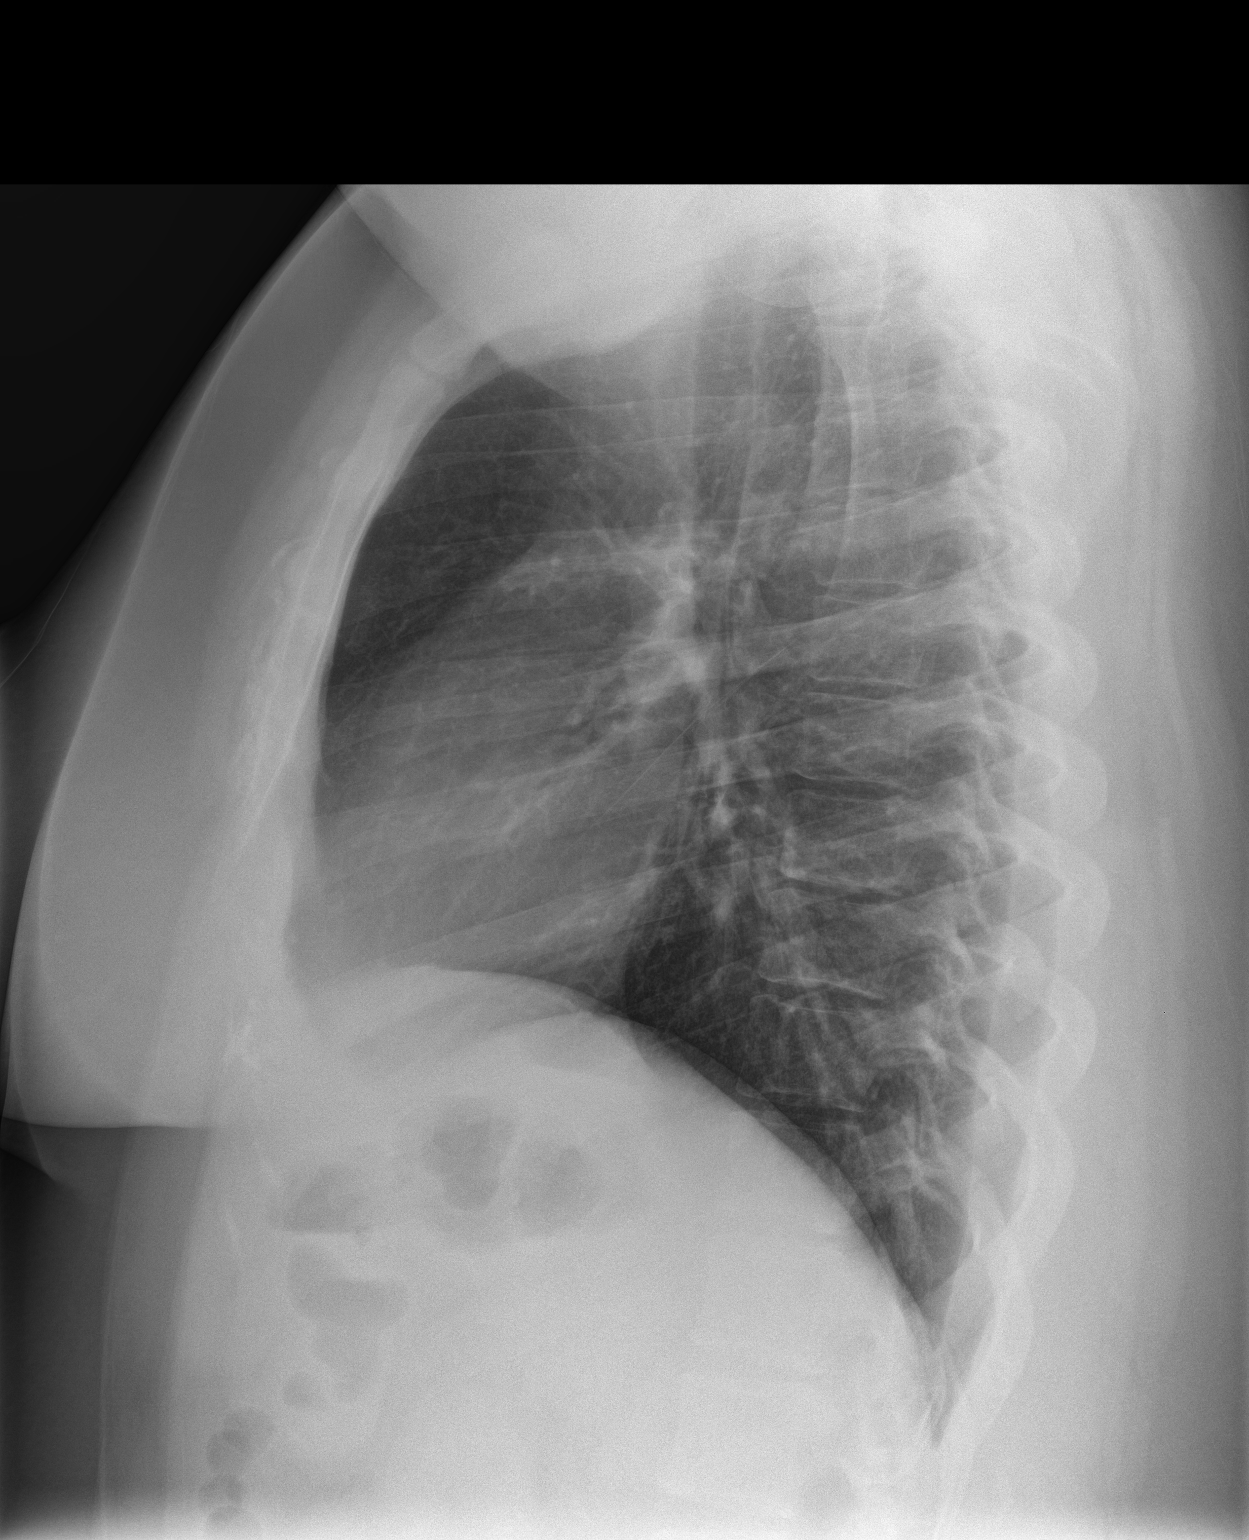

[2 of 2 positions shown; findings below may reference images not displayed]

FINDINGS: Lungs are clear. Heart size and pulmonary vascularity are normal. No
adenopathy. No bone lesions.
IMPRESSION: No edema or consolidation.

## 2018-01-19 DIAGNOSIS — J309 Allergic rhinitis, unspecified: Secondary | ICD-10-CM | POA: Insufficient documentation

## 2018-06-11 DIAGNOSIS — G43109 Migraine with aura, not intractable, without status migrainosus: Secondary | ICD-10-CM | POA: Insufficient documentation

## 2019-03-24 ENCOUNTER — Other Ambulatory Visit: Payer: Self-pay | Admitting: Family Medicine

## 2019-03-24 DIAGNOSIS — R1011 Right upper quadrant pain: Secondary | ICD-10-CM

## 2019-03-24 DIAGNOSIS — R102 Pelvic and perineal pain: Secondary | ICD-10-CM

## 2019-07-16 ENCOUNTER — Ambulatory Visit (INDEPENDENT_AMBULATORY_CARE_PROVIDER_SITE_OTHER): Payer: BC Managed Care – PPO | Admitting: Podiatry

## 2019-07-16 ENCOUNTER — Ambulatory Visit (INDEPENDENT_AMBULATORY_CARE_PROVIDER_SITE_OTHER): Payer: BC Managed Care – PPO

## 2019-07-16 ENCOUNTER — Encounter: Payer: Self-pay | Admitting: Podiatry

## 2019-07-16 ENCOUNTER — Other Ambulatory Visit: Payer: Self-pay

## 2019-07-16 DIAGNOSIS — M722 Plantar fascial fibromatosis: Secondary | ICD-10-CM | POA: Diagnosis not present

## 2019-07-16 MED ORDER — METHYLPREDNISOLONE 4 MG PO TBPK: ORAL_TABLET | ORAL | 0 refills | Status: DC

## 2019-07-16 MED ORDER — MELOXICAM 15 MG PO TABS: 15.0000 mg | ORAL_TABLET | Freq: Every day | ORAL | 1 refills | Status: DC

## 2019-07-19 NOTE — Progress Notes (Signed)
   Subjective: 33 y.o. female presenting today with a chief complaint of constant sharp pain in the left heel that began 3 months ago. She reports associated pain that radiates into the arch. She has been stretching the foot, icing it and taking Aleve with no significant relief. Walking and being on the foot increases the pain. She reports h/o plantar fasciitis. Patient is here for further evaluation and treatment.   Past Medical History:  Diagnosis Date  . Chest pressure 02/26/2017   MID-STERNAL  . GERD (gastroesophageal reflux disease)    rare-tums prn  . Migraines   . Morbid obesity ([place])      Objective: Physical Exam General: The patient is alert and oriented x3 in no acute distress.  Dermatology: Skin is warm, dry and supple bilateral lower extremities. Negative for open lesions or macerations bilateral.   Vascular: Dorsalis Pedis and Posterior Tibial pulses palpable bilateral.  Capillary fill time is immediate to all digits.  Neurological: Epicritic and protective threshold intact bilateral.   Musculoskeletal: Tenderness to palpation to the plantar aspect of the left heel along the plantar fascia. All other joints range of motion within normal limits bilateral. Strength 5/5 in all groups bilateral.   Radiographic exam: Normal osseous mineralization. Joint spaces preserved. No fracture/dislocation/boney destruction. No other soft tissue abnormalities or radiopaque foreign bodies.   Assessment: 1. Plantar fasciitis left foot 2. H/o EPF right 2015 by Dr. Paulla Dolly  Plan of Care:  1. Patient evaluated. Xrays reviewed.   2. Injection of 0.5cc Celestone soluspan injected into the left plantar fascia.  3. Rx for Medrol Dose Pak placed 4. Rx for Meloxicam ordered for patient. 5. Plantar fascial band(s) dispensed  6. Instructed patient regarding therapies and modalities at home to alleviate symptoms.  7. Night splint dispensed.  8. Return to clinic in 4 weeks.     Edrick Kins, DPM [facility]  Dr. Edrick Kins, DPM    [address], [place] [address]                Office [phone]  Fax [phone]

## 2019-07-27 ENCOUNTER — Ambulatory Visit (INDEPENDENT_AMBULATORY_CARE_PROVIDER_SITE_OTHER): Payer: BC Managed Care – PPO | Admitting: Podiatry

## 2019-07-27 ENCOUNTER — Other Ambulatory Visit: Payer: Self-pay

## 2019-07-27 ENCOUNTER — Encounter: Payer: Self-pay | Admitting: Podiatry

## 2019-07-27 DIAGNOSIS — M722 Plantar fascial fibromatosis: Secondary | ICD-10-CM

## 2019-07-27 DIAGNOSIS — M79672 Pain in left foot: Secondary | ICD-10-CM

## 2019-07-27 DIAGNOSIS — M7732 Calcaneal spur, left foot: Secondary | ICD-10-CM

## 2019-07-31 ENCOUNTER — Encounter: Payer: Self-pay | Admitting: Podiatry

## 2019-07-31 NOTE — Progress Notes (Signed)
Subjective:  Patient ID: Emily Norris, female    DOB: 28-Nov-1985,  MRN: [id number]  Chief Complaint  Patient presents with  . Plantar Fasciitis    33 y.o. female presents with the above complaint.  Patient presents with a follow-up from heel pain on the left side.  Patient is known to Dr. Logan Bores who has been treating for the left plantar fasciitis she states the left foot is still painful without any kind of relief.  She has failed injection brace meloxicam no help.  Patient states that shooting is now over to the arch and her lateral side of the foot is also numb and tingly.  She denies any other acute complaints.  She denies any relief from any of the treatment modalities tried in the past.   Review of Systems: Negative except as noted in the HPI. Denies N/V/F/Ch.  Past Medical History:  Diagnosis Date  . Chest pressure 02/26/2017   MID-STERNAL  . GERD (gastroesophageal reflux disease)    rare-tums prn  . Migraines   . Morbid obesity (HCC)     Current Outpatient Medications:  .  buPROPion (WELLBUTRIN XL) 300 MG 24 hr tablet, Take 1 tablet by mouth every morning., Disp: , Rfl: 11 .  fexofenadine (ALLEGRA) 180 MG tablet, Take by mouth., Disp: , Rfl:  .  ibuprofen (ADVIL,MOTRIN) 600 MG tablet, Take 1 tablet (600 mg total) by mouth every 6 (six) hours as needed., Disp: 30 tablet, Rfl: 0 .  meloxicam (MOBIC) 15 MG tablet, Take 1 tablet (15 mg total) by mouth daily., Disp: 30 tablet, Rfl: 1 .  methylPREDNISolone (MEDROL DOSEPAK) 4 MG TBPK tablet, 6 day dose pack - take as directed, Disp: 21 tablet, Rfl: 0 .  naproxen sodium (ALEVE) 220 MG tablet, Take by mouth., Disp: , Rfl:  .  propranolol (INDERAL) 20 MG tablet, Take by mouth., Disp: , Rfl:   Social History   Tobacco Use  Smoking Status Never Smoker  Smokeless Tobacco Never Used    Allergies  Allergen Reactions  . Diclofenac Diarrhea  . Imitrex [Sumatriptan] Nausea And Vomiting  . Penicillins Other (See Comments)   UNKNOWN  . Sulfa Antibiotics Swelling and Rash  . Sulfasalazine Rash and Swelling   Objective:  There were no vitals filed for this visit. There is no height or weight on file to calculate BMI. Constitutional Well developed. Well nourished.  Vascular Dorsalis pedis pulses palpable bilaterally. Posterior tibial pulses palpable bilaterally. Capillary refill normal to all digits.  No cyanosis or clubbing noted. Pedal hair growth normal.  Neurologic Normal speech. Oriented to person, place, and time. Epicritic sensation to light touch grossly present bilaterally.  Dermatologic Nails well groomed and normal in appearance. No open wounds. No skin lesions.  Orthopedic: Normal joint ROM without pain or crepitus bilaterally. No visible deformities. Tender to palpation at the calcaneal tuber left. No pain with calcaneal squeeze left. Ankle ROM diminished range of motion left. Silfverskiold Test: positive left.   Radiographs: None  Assessment:   1. Plantar fasciitis of left foot   2. Pain of left foot   3. Heel spur, left    Plan:  Patient was evaluated and treated and all questions answered.  Plantar Fasciitis, left -Given that patient has not relief of pain and her pain has progressively gotten worse.  I believe patient will benefit from a cam boot immobilization to help calm the acute inflammation of the left heel. -Patient does not want another injection in her foot and therefore  I will hold off on it for now. -Cam boot was dispensed  Return in about 4 weeks (around 08/24/2019).

## 2019-08-03 ENCOUNTER — Ambulatory Visit: Payer: BC Managed Care – PPO | Admitting: Podiatry

## 2019-08-06 ENCOUNTER — Other Ambulatory Visit: Payer: Self-pay

## 2019-08-06 ENCOUNTER — Encounter: Payer: Self-pay | Admitting: Podiatry

## 2019-08-06 ENCOUNTER — Ambulatory Visit (INDEPENDENT_AMBULATORY_CARE_PROVIDER_SITE_OTHER): Payer: BC Managed Care – PPO | Admitting: Podiatry

## 2019-08-06 DIAGNOSIS — M216X2 Other acquired deformities of left foot: Secondary | ICD-10-CM | POA: Diagnosis not present

## 2019-08-06 DIAGNOSIS — M21862 Other specified acquired deformities of left lower leg: Secondary | ICD-10-CM

## 2019-08-06 DIAGNOSIS — M722 Plantar fascial fibromatosis: Secondary | ICD-10-CM

## 2019-08-06 MED ORDER — IBUPROFEN 600 MG PO TABS: 600.0000 mg | ORAL_TABLET | Freq: Four times a day (QID) | ORAL | 1 refills | Status: DC | PRN

## 2019-08-06 MED ORDER — TRAMADOL HCL 50 MG PO TABS: 50.0000 mg | ORAL_TABLET | Freq: Four times a day (QID) | ORAL | 0 refills | Status: AC | PRN

## 2019-08-06 NOTE — Patient Instructions (Signed)
Pre-Operative Instructions  Congratulations, you have decided to take an important step towards improving your quality of life.  You can be assured that the doctors and staff at [facility] will be with you every step of the way.  Here are some important things you should know:  1. Plan to be at the surgery center/hospital at least 1 (one) hour prior to your scheduled time, unless otherwise directed by the surgical center/hospital staff.  You must have a responsible adult accompany you, remain during the surgery and drive you home.  Make sure you have directions to the surgical center/hospital to ensure you arrive on time. 2. If you are having surgery at [facility] or [facility], you will need a copy of your medical history and physical form from your family physician within one month prior to the date of surgery. We will give you a form for your primary physician to complete.  3. We make every effort to accommodate the date you request for surgery.  However, there are times where surgery dates or times have to be moved.  We will contact you as soon as possible if a change in schedule is required.   4. No aspirin/ibuprofen for one week before surgery.  If you are on aspirin, any non-steroidal anti-inflammatory medications (Mobic, Aleve, Ibuprofen) should not be taken seven (7) days prior to your surgery.  You make take Tylenol for pain prior to surgery.  5. Medications - If you are taking daily heart and blood pressure medications, seizure, reflux, allergy, asthma, anxiety, pain or diabetes medications, make sure you notify the surgery center/hospital before the day of surgery so they can tell you which medications you should take or avoid the day of surgery. 6. No food or drink after midnight the night before surgery unless directed otherwise by surgical center/hospital staff. 7. No alcoholic beverages 24-hours prior to surgery.  No smoking 24-hours prior or 24-hours after  surgery. 8. Wear loose pants or shorts. They should be loose enough to fit over bandages, boots, and casts. 9. Don't wear slip-on shoes. Sneakers are preferred. 10. Bring your boot with you to the surgery center/hospital.  Also bring crutches or a walker if your physician has prescribed it for you.  If you do not have this equipment, it will be provided for you after surgery. 11. If you have not been contacted by the surgery center/hospital by the day before your surgery, call to confirm the date and time of your surgery. 12. Leave-time from work may vary depending on the type of surgery you have.  Appropriate arrangements should be made prior to surgery with your employer. 13. Prescriptions will be provided immediately following surgery by your doctor.  Fill these as soon as possible after surgery and take the medication as directed. Pain medications will not be refilled on weekends and must be approved by the doctor. 14. Remove nail polish on the operative foot and avoid getting pedicures prior to surgery. 15. Wash the night before surgery.  The night before surgery wash the foot and leg well with water and the antibacterial soap provided. Be sure to pay special attention to beneath the toenails and in between the toes.  Wash for at least three (3) minutes. Rinse thoroughly with water and dry well with a towel.  Perform this wash unless told not to do so by your physician.  Enclosed: 1 Ice pack (please put in freezer the night before surgery)   1 Hibiclens skin cleaner     Pre-op instructions  If you have any questions regarding the instructions, please do not hesitate to call our office.  [place]: [address], [place], [place] [address] -- [phone]  [place]: [address] [place], [place] [address] -- [phone]  [place]: [address]  [place], [place] [address] -- [phone]   Website: [email or web address] 

## 2019-08-09 ENCOUNTER — Telehealth: Payer: Self-pay | Admitting: *Deleted

## 2019-08-09 NOTE — Telephone Encounter (Signed)
"  I am calling to schedule my surgery with Dr. Amalia Hailey."  Do you have a date that you like?  "I'd like to do it as soon as possible."  Dr. Amalia Hailey can do it on December 31.  "That date will be fine.  What time do I need to be there?"  You will receive a call from someone from the surgical center a day or two prior to your surgery date and that person will give you your arrival time.  "Okay, thank you so much."

## 2019-08-10 NOTE — Progress Notes (Signed)
   Subjective: 33 y.o. female presenting today for follow up evaluation of left foot pain. She states the pain has not improved. She reports significant continued pain to the heel and arch. She has been using the CAM boot but denies any relief. Being on the foot increases the pain. Patient is here for further evaluation and treatment.    Past Medical History:  Diagnosis Date  . Chest pressure 02/26/2017   MID-STERNAL  . GERD (gastroesophageal reflux disease)    rare-tums prn  . Migraines   . Morbid obesity ([place])      Objective: Physical Exam General: The patient is alert and oriented x3 in no acute distress.  Dermatology: Skin is warm, dry and supple bilateral lower extremities. Negative for open lesions or macerations bilateral.   Vascular: Dorsalis Pedis and Posterior Tibial pulses palpable bilateral.  Capillary fill time is immediate to all digits.  Neurological: Epicritic and protective threshold intact bilateral.   Musculoskeletal: Tenderness to palpation to the plantar aspect of the left heel along the plantar fascia. Limited ROM with ankle joint dorsiflexion. Positive Silfverskiold test. All other joints range of motion within normal limits bilateral. Strength 5/5 in all groups bilateral.   Assessment: 1. Plantar fasciitis left foot 2. Gastrocnemius equinus left 3. H/o EPF right   Plan of Care:  1. Patient evaluated.  2. Today we discussed the conservative versus surgical management of the presenting pathology. The patient opts for surgical management. All possible complications and details of the procedure were explained. All patient questions were answered. No guarantees were expressed or implied. 3. Authorization for surgery was initiated today. Surgery will consist of EPF left; gastrocnemius aponeurosis lengthening left.  4. Prescription for Tramadol 50 mg provided to patient.  5. Prescription for Motrin 800 mg provided to patient.  6. Continue using CAM boot.  7.  Return to clinic one week post op.   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    Edrick Kins, DPM [facility]  Dr. Edrick Kins, DPM    [address], [place] [address]                Office [phone]  Fax [phone]

## 2019-08-17 ENCOUNTER — Ambulatory Visit: Payer: BC Managed Care – PPO | Admitting: Podiatry

## 2019-08-20 ENCOUNTER — Telehealth: Payer: Self-pay | Admitting: *Deleted

## 2019-08-20 NOTE — Telephone Encounter (Signed)
DOS 09/02/2019 ENDOSCOPIC PLANTAR FASCIOTOMY - 46803 AND GASTROCNEMIUS RECESSION - 21224 LEFT FOOT  BCBS: Eligibility Date - 04/02/2010 - 09/01/9998   In-Network    Max Per Benefit Period Year-to-Date Remaining  CoInsurance     Deductible  $325.00 $0.00  Out-Of-Pocket  $2,500.00 $1,714.00   In-Network  [employer] Coinsurance Authorization Required  Not Applicable  82%  No  Messages: [place]    Place of Service: [facility]

## 2019-08-30 ENCOUNTER — Telehealth: Payer: Self-pay | Admitting: *Deleted

## 2019-08-30 NOTE — Telephone Encounter (Signed)
"  I have an upcoming surgery on Thursday with Dr. Amalia Hailey.  I have a quick question about paperwork.  Paperwork that I need filled out for my work if I need to bring it on the surgery day or if I need to bring it to the office before my surgery day."

## 2019-09-01 DIAGNOSIS — M79676 Pain in unspecified toe(s): Secondary | ICD-10-CM

## 2019-09-02 ENCOUNTER — Encounter: Payer: Self-pay | Admitting: Podiatry

## 2019-09-02 ENCOUNTER — Other Ambulatory Visit: Payer: Self-pay | Admitting: Podiatry

## 2019-09-02 DIAGNOSIS — M722 Plantar fascial fibromatosis: Secondary | ICD-10-CM | POA: Diagnosis not present

## 2019-09-02 DIAGNOSIS — M216X2 Other acquired deformities of left foot: Secondary | ICD-10-CM

## 2019-09-02 MED ORDER — OXYCODONE-ACETAMINOPHEN 5-325 MG PO TABS: 1.0000 | ORAL_TABLET | Freq: Four times a day (QID) | ORAL | 0 refills | Status: DC | PRN

## 2019-09-02 NOTE — Progress Notes (Signed)
PRN postop 

## 2019-09-10 ENCOUNTER — Other Ambulatory Visit: Payer: Self-pay

## 2019-09-10 ENCOUNTER — Ambulatory Visit (INDEPENDENT_AMBULATORY_CARE_PROVIDER_SITE_OTHER): Payer: Self-pay | Admitting: Podiatry

## 2019-09-10 ENCOUNTER — Encounter: Payer: Self-pay | Admitting: Podiatry

## 2019-09-10 VITALS — BP 121/73 | HR 88 | Temp 99.0°F

## 2019-09-10 DIAGNOSIS — M216X2 Other acquired deformities of left foot: Secondary | ICD-10-CM

## 2019-09-10 DIAGNOSIS — M722 Plantar fascial fibromatosis: Secondary | ICD-10-CM

## 2019-09-10 DIAGNOSIS — M21862 Other specified acquired deformities of left lower leg: Secondary | ICD-10-CM

## 2019-09-10 DIAGNOSIS — Z9889 Other specified postprocedural states: Secondary | ICD-10-CM

## 2019-09-10 MED ORDER — CYCLOBENZAPRINE HCL 5 MG PO TABS: 5.0000 mg | ORAL_TABLET | Freq: Three times a day (TID) | ORAL | 1 refills | Status: DC | PRN

## 2019-09-13 NOTE — Progress Notes (Signed)
   Subjective:  Patient presents today status post EPF and TAL left. DOS: 09/02/2019. She states she is doing well. She reports some mild pain that is tolerable with Ibuprofen. She reports some associated intermittent nausea. She has been using the CAM boot as directed. She denies aggravating factors. Patient is here for further evaluation and treatment.   Past Medical History:  Diagnosis Date  . Chest pressure 02/26/2017   MID-STERNAL  . GERD (gastroesophageal reflux disease)    rare-tums prn  . Migraines   . Morbid obesity ([facility])       Objective/Physical Exam Neurovascular status intact.  Skin incisions appear to be well coapted with sutures and staples intact. No sign of infectious process noted. No dehiscence. No active bleeding noted. Moderate edema noted to the surgical extremity.   Assessment: 1. s/p EPF and TAL left. DOS: 09/02/2019   Plan of Care:  1. Patient was evaluated.  2. Ace wraps provided to patient.  3. Continue weightbearing in CAM boot.  4. Prescription for Flexeril 5 mg PO QHS provided to patient.  5. Return to clinic in one week for suture removal.    Felecia Shelling, DPM [facility]  Dr. Felecia Shelling, DPM    [address]                                        [place], [place] [address]                Office [phone]  Fax [phone]

## 2019-09-17 ENCOUNTER — Encounter: Payer: Self-pay | Admitting: Podiatry

## 2019-09-17 ENCOUNTER — Ambulatory Visit (INDEPENDENT_AMBULATORY_CARE_PROVIDER_SITE_OTHER): Payer: BC Managed Care – PPO | Admitting: Podiatry

## 2019-09-17 ENCOUNTER — Other Ambulatory Visit: Payer: Self-pay

## 2019-09-17 ENCOUNTER — Other Ambulatory Visit: Payer: Self-pay | Admitting: Podiatry

## 2019-09-17 DIAGNOSIS — M216X2 Other acquired deformities of left foot: Secondary | ICD-10-CM

## 2019-09-17 DIAGNOSIS — Z9889 Other specified postprocedural states: Secondary | ICD-10-CM

## 2019-09-17 DIAGNOSIS — M21862 Other specified acquired deformities of left lower leg: Secondary | ICD-10-CM

## 2019-09-17 DIAGNOSIS — M722 Plantar fascial fibromatosis: Secondary | ICD-10-CM

## 2019-09-19 NOTE — Progress Notes (Signed)
   Subjective:  Patient presents today status post EPF and TAL left. DOS: 09/02/2019. She states she is doing well. She reports a pulling sensation in the posterior heel. She has been using the CAM boot and taking Flexeril as directed. Patient is here for further evaluation and treatment.   Past Medical History:  Diagnosis Date  . Chest pressure 02/26/2017   MID-STERNAL  . GERD (gastroesophageal reflux disease)    rare-tums prn  . Migraines   . Morbid obesity ([facility])       Objective/Physical Exam Neurovascular status intact.  Skin incisions appear to be well coapted with sutures and staples intact. No sign of infectious process noted. No dehiscence. No active bleeding noted. Moderate edema noted to the surgical extremity.   Assessment: 1. s/p EPF and TAL left. DOS: 09/02/2019   Plan of Care:  1. Patient was evaluated.  2. Sutures removed.  3. Compression anklet dispensed.  4. Expected return to work on 11/12/2019.  5. Return to clinic in 3 weeks to transition in to tennis shoes.    Felecia Shelling, DPM [facility]  Dr. Felecia Shelling, DPM    [address]                                        [place], [place] [address]                Office [phone]  Fax [phone]

## 2019-10-01 ENCOUNTER — Encounter: Payer: BC Managed Care – PPO | Admitting: Podiatry

## 2019-10-08 ENCOUNTER — Ambulatory Visit (INDEPENDENT_AMBULATORY_CARE_PROVIDER_SITE_OTHER): Payer: BC Managed Care – PPO | Admitting: Podiatry

## 2019-10-08 ENCOUNTER — Encounter: Payer: Self-pay | Admitting: Podiatry

## 2019-10-08 ENCOUNTER — Other Ambulatory Visit: Payer: Self-pay

## 2019-10-08 DIAGNOSIS — M722 Plantar fascial fibromatosis: Secondary | ICD-10-CM

## 2019-10-08 DIAGNOSIS — M216X2 Other acquired deformities of left foot: Secondary | ICD-10-CM

## 2019-10-08 DIAGNOSIS — Z9889 Other specified postprocedural states: Secondary | ICD-10-CM

## 2019-10-08 DIAGNOSIS — M21862 Other specified acquired deformities of left lower leg: Secondary | ICD-10-CM

## 2019-10-12 NOTE — Progress Notes (Signed)
   Subjective:  Patient presents today status post EPF and TAL left. DOS: 09/02/2019. She reports continued pain that is worsened with standing or being on the foot. She states she can be ambulatory for no more than 10 minutes without experiencing pain. She states the pain is primarily in the heel and is greatest in the morning time. She has been using the CAM boot and compression anklet as directed. Patient is here for further evaluation and treatment.   Past Medical History:  Diagnosis Date  . Chest pressure 02/26/2017   MID-STERNAL  . GERD (gastroesophageal reflux disease)    rare-tums prn  . Migraines   . Morbid obesity ([facility])       Objective/Physical Exam Neurovascular status intact.  Skin incisions appear to be well coapted. No sign of infectious process noted. No dehiscence. No active bleeding noted. Moderate edema noted to the surgical extremity.   Assessment: 1. s/p EPF and TAL left. DOS: 09/02/2019   Plan of Care:  1. Patient was evaluated.  2. Continue weightbearing in CAM boot.  3. Continue using compression anklet.  4. Physical therapy ordered from [facility].  5. Expected return to work on 11/12/2019.  6. Return to clinic in 4 weeks.    Felecia Shelling, DPM [facility]  Dr. Felecia Shelling, DPM    [address]                                        [place], [place] [address]                Office [phone]  Fax [phone]

## 2019-11-05 ENCOUNTER — Ambulatory Visit (INDEPENDENT_AMBULATORY_CARE_PROVIDER_SITE_OTHER): Payer: BC Managed Care – PPO | Admitting: Podiatry

## 2019-11-05 ENCOUNTER — Encounter: Payer: Self-pay | Admitting: Podiatry

## 2019-11-05 ENCOUNTER — Encounter: Payer: Self-pay | Admitting: *Deleted

## 2019-11-05 ENCOUNTER — Other Ambulatory Visit: Payer: Self-pay

## 2019-11-05 DIAGNOSIS — M216X2 Other acquired deformities of left foot: Secondary | ICD-10-CM

## 2019-11-05 DIAGNOSIS — Z9889 Other specified postprocedural states: Secondary | ICD-10-CM

## 2019-11-05 DIAGNOSIS — M722 Plantar fascial fibromatosis: Secondary | ICD-10-CM

## 2019-11-05 DIAGNOSIS — M21862 Other specified acquired deformities of left lower leg: Secondary | ICD-10-CM

## 2019-11-08 NOTE — Progress Notes (Signed)
   Subjective:  Patient presents today status post EPF and TAL left. DOS: 09/02/2019. She states she is doing well. She denies any pain or modifying factors. She has been using the CAM boot and doing physical therapy as directed. Patient is here for further evaluation and treatment.   Past Medical History:  Diagnosis Date  . Chest pressure 02/26/2017   MID-STERNAL  . GERD (gastroesophageal reflux disease)    rare-tums prn  . Migraines   . Morbid obesity ([facility])       Objective/Physical Exam Neurovascular status intact.  Skin incisions appear to be well coapted. No sign of infectious process noted. No dehiscence. No active bleeding noted. Moderate edema noted to the surgical extremity.   Assessment: 1. s/p EPF and TAL left. DOS: 09/02/2019   Plan of Care:  1. Patient was evaluated.  2. May resume full activity with no restrictions.  3. Continue physical therapy as needed.  4. Return to work on 11/12/2019.  5. Return to clinic as needed.     Felecia Shelling, DPM [facility]  Dr. Felecia Shelling, DPM    [address]                                        [place], [place] [address]                Office [phone]  Fax [phone]

## 2020-04-13 ENCOUNTER — Ambulatory Visit
Admission: EM | Admit: 2020-04-13 | Discharge: 2020-04-13 | Disposition: A | Discharge status: Home / Self Care | Payer: BC Managed Care – PPO | Attending: Family Medicine | Admitting: Family Medicine

## 2020-04-13 ENCOUNTER — Encounter: Payer: Self-pay | Admitting: Emergency Medicine

## 2020-04-13 DIAGNOSIS — K921 Melena: Secondary | ICD-10-CM | POA: Diagnosis not present

## 2020-04-13 DIAGNOSIS — R197 Diarrhea, unspecified: Secondary | ICD-10-CM | POA: Diagnosis not present

## 2020-04-13 DIAGNOSIS — R195 Other fecal abnormalities: Secondary | ICD-10-CM | POA: Diagnosis present

## 2020-04-13 LAB — COMPREHENSIVE METABOLIC PANEL
ALT: 26 U/L (ref 0–44)
AST: 22 U/L (ref 15–41)
Albumin: 4.2 g/dL (ref 3.5–5.0)
Alkaline Phosphatase: 63 U/L (ref 38–126)
Anion gap: 9 (ref 5–15)
BUN: 12 mg/dL (ref 6–20)
CO2: 22 mmol/L (ref 22–32)
Calcium: 8.9 mg/dL (ref 8.9–10.3)
Chloride: 104 mmol/L (ref 98–111)
Creatinine, Ser: 0.66 mg/dL (ref 0.44–1.00)
GFR calc Af Amer: >60 mL/min (ref >60)
GFR calc non Af Amer: >60 mL/min (ref >60)
Glucose, Bld: 87 mg/dL (ref 70–99)
Potassium: 4.1 mmol/L (ref 3.5–5.1)
Sodium: 135 mmol/L (ref 135–145)
Total Bilirubin: 1.1 mg/dL (ref 0.3–1.2)
Total Protein: 7.6 g/dL (ref 6.5–8.1)

## 2020-04-13 LAB — CBC WITH DIFFERENTIAL/PLATELET
Abs Immature Granulocytes: 0.05 10*3/uL (ref 0.00–0.07)
Basophils Absolute: 0.1 10*3/uL (ref 0.0–0.1)
Basophils Relative: 1 %
Eosinophils Absolute: 0.1 10*3/uL (ref 0.0–0.5)
Eosinophils Relative: 1 %
HCT: 39 % (ref 36.0–46.0)
Hemoglobin: 12.8 g/dL (ref 12.0–15.0)
Immature Granulocytes: 1 %
Lymphocytes Relative: 23 %
Lymphs Abs: 2.3 10*3/uL (ref 0.7–4.0)
MCH: 28.9 pg (ref 26.0–34.0)
MCHC: 32.8 g/dL (ref 30.0–36.0)
MCV: 88 fL (ref 80.0–100.0)
Monocytes Absolute: 0.6 10*3/uL (ref 0.1–1.0)
Monocytes Relative: 6 %
Neutro Abs: 7.1 10*3/uL (ref 1.7–7.7)
Neutrophils Relative %: 68 %
Platelets: 429 10*3/uL — ABNORMAL HIGH (ref 150–400)
RBC: 4.43 MIL/uL (ref 3.87–5.11)
RDW: 12.7 % (ref 11.5–15.5)
WBC: 10.1 10*3/uL (ref 4.0–10.5)
nRBC: 0 % (ref 0.0–0.2)

## 2020-04-13 MED ORDER — TRAMADOL HCL 50 MG PO TABS: 50.0000 mg | ORAL_TABLET | Freq: Three times a day (TID) | ORAL | 0 refills | Status: DC | PRN

## 2020-04-13 NOTE — ED Triage Notes (Signed)
Patient c/o abdominal pain that started this morning. She states she had the urge to have a BM this morning and she had one and states since then she has had bloody mucus.

## 2020-04-13 NOTE — Discharge Instructions (Addendum)
Lots of fluids.  Return with stool sample.  If worsens, go to the hospital.  Take care  Dr. Adriana Simas

## 2020-04-14 NOTE — ED Provider Notes (Signed)
[facility]    CSN: [id number] Arrival date & time: 04/13/20  1314      History   Chief Complaint Chief Complaint  Patient presents with   Abdominal Pain   Blood In Stools   HPI  34 year old female presents with the above complaints.  Patient reports that she awoke at 3 AM and had the urge to defecate.  She went to the bathroom and had diarrhea.  Subsequently had blood and mucus in her stool.  She reports that she has had associated abdominal pain.  Also reports back pain.  No fever.  She states that she has had additional episodes of blood in her stool.  Patient reports that she ate fried chicken last night.  States that she has some nausea but no vomiting.  Pain 6/10 in severity.  No relieving factors.  No other complaints.  Past Medical History:  Diagnosis Date   Chest pressure 02/26/2017   MID-STERNAL   GERD (gastroesophageal reflux disease)    rare-tums prn   Migraines    Morbid obesity [facility])     Patient Active Problem List   Diagnosis Date Noted   Migraine with aura and without status migrainosus, not intractable 06/11/2018   Allergic rhinitis 01/19/2018   Morbid obesity with BMI of 50.0-59.9, adult (HCC) 03/03/2017   Nicotine vapor product user 08/02/2016   Bilateral carpal tunnel syndrome 09/29/2015   Numbness and tingling 09/21/2015   Anxiety 03/15/2015   Back pain 06/18/2012    Past Surgical History:  Procedure Laterality Date   CARPAL TUNNEL RELEASE Right 03/06/2017   Procedure: CARPAL TUNNEL RELEASE;  Surgeon: Kennedy Bucker, MD;  Location: [facility] ORS;  Service: Orthopedics;  Laterality: Right;   PLANTAR FASCIA SURGERY Right 2015    OB History   No obstetric history on file.      Home Medications    Prior to Admission medications   Medication Sig Start Date End Date Taking? Authorizing Provider  buPROPion (WELLBUTRIN XL) 300 MG 24 hr tablet Take 1 tablet by mouth every morning. 01/24/17  Yes [provider]    fexofenadine (ALLEGRA) 180 MG tablet Take by mouth. 01/04/17  Yes [provider]  propranolol (INDERAL) 20 MG tablet Take by mouth. 07/12/19  Yes [provider]  traMADol (ULTRAM) 50 MG tablet Take 1 tablet (50 mg total) by mouth every 8 (eight) hours as needed. 04/13/20   Tommie Sams, DO    Family History History reviewed. No pertinent family history.  Social History Social History   Tobacco Use   Smoking status: Never Smoker   Smokeless tobacco: Never Used  Building services engineer Use: Every day  Substance Use Topics   Alcohol use: Yes    Comment: rare   Drug use: No     Allergies   Diclofenac, Imitrex [sumatriptan], Penicillins, Sulfa antibiotics, and Sulfasalazine   Review of Systems Review of Systems  Constitutional: Negative for fever.  Gastrointestinal: Positive for abdominal pain, blood in stool, diarrhea and nausea.   Physical Exam Triage Vital Signs ED Triage Vitals  Enc Vitals Group     BP 04/13/20 1509 (!) 139/96     Pulse Rate 04/13/20 1509 67     Resp 04/13/20 1509 18     Temp 04/13/20 1509 98.3 F (36.8 C)     Temp Source 04/13/20 1509 Oral     SpO2 04/13/20 1509 100 %     Weight 04/13/20 1507 (!) 308 lb (139.7 kg)  Height 04/13/20 1507 5\' 5"  (1.651 m)     Head Circumference --      Peak Flow --      Pain Score 04/13/20 1507 6     Pain Loc --      Pain Edu? --      Excl. in GC? --    Updated Vital Signs BP (!) 139/96 (BP Location: Right Arm)    Pulse 67    Temp 98.3 F (36.8 C) (Oral)    Resp 18    Ht 5\' 5"  (1.651 m)    Wt (!) 139.7 kg    LMP 03/27/2020    SpO2 100%    BMI 51.25 kg/m   Visual Acuity Right Eye Distance:   Left Eye Distance:   Bilateral Distance:    Right Eye Near:   Left Eye Near:    Bilateral Near:     Physical Exam Vitals and nursing note reviewed.  Constitutional:      General: She is not in acute distress.    Appearance: Normal appearance. She is obese. She is not ill-appearing.  HENT:      Head: Normocephalic and atraumatic.  Eyes:     General:        Right eye: No discharge.        Left eye: No discharge.     Conjunctiva/sclera: Conjunctivae normal.  Cardiovascular:     Rate and Rhythm: Normal rate and regular rhythm.  Pulmonary:     Effort: Pulmonary effort is normal.     Breath sounds: Normal breath sounds. No wheezing, rhonchi or rales.  Abdominal:     General: There is no distension.     Palpations: Abdomen is soft.     Comments: Tender to palpation in the lower abdomen.  No rebound or guarding.  Neurological:     Mental Status: She is alert.  Psychiatric:        Mood and Affect: Mood normal.        Behavior: Behavior normal.    UC Treatments / Results  Labs (all labs ordered are listed, but only abnormal results are displayed) Labs Reviewed  CBC WITH DIFFERENTIAL/PLATELET - Abnormal; Notable for the following components:      Result Value   Platelets 429 (*)    All other components within normal limits  COMPREHENSIVE METABOLIC PANEL    EKG   Radiology No results found.  Procedures Procedures (including critical care time)  Medications Ordered in UC Medications - No data to display  Initial Impression / Assessment and Plan / UC Course  I have reviewed the triage vital signs and the nursing notes.  Pertinent labs & imaging results that were available during my care of the patient were reviewed by me and considered in my medical decision making (see chart for details).    34 year old female presents with diarrhea abdominal pain.  Patient has had blood and mucus in her stool.  Concern for infectious diarrhea.  Laboratory studies obtained and were normal except for mildly elevated platelet count.  Patient was given instructions regarding stool sample collection.  She will bring this back to the clinic.  We will test for infectious pathogens via GI panel PCR.  Awaiting test results.  Patient is to go to the hospital if she worsens or fails to  improve.  Tramadol for pain.  Final Clinical Impressions(s) / UC Diagnoses   Final diagnoses:  Diarrhea of presumed infectious origin  Bloody stool  Mucus in stool  Discharge Instructions     Lots of fluids.  Return with stool sample.  If worsens, go to the hospital.  Take care  Dr. Adriana Simas    ED Prescriptions    Medication Sig Dispense Auth. Provider   traMADol (ULTRAM) 50 MG tablet Take 1 tablet (50 mg total) by mouth every 8 (eight) hours as needed. 15 tablet Everlene Other G, DO     I have reviewed the PDMP during this encounter.   Everlene Other [place], [place] 04/14/20 [phone]

## 2023-04-05 ENCOUNTER — Encounter: Payer: Self-pay | Admitting: Emergency Medicine

## 2023-04-05 ENCOUNTER — Ambulatory Visit
Admission: EM | Admit: 2023-04-05 | Discharge: 2023-04-05 | Disposition: A | Discharge status: Home / Self Care | Payer: BC Managed Care – PPO | Source: Home / Self Care

## 2023-04-05 DIAGNOSIS — R519 Headache, unspecified: Secondary | ICD-10-CM

## 2023-04-05 MED ORDER — KETOROLAC TROMETHAMINE 30 MG/ML IJ SOLN
30.0000 mg | Freq: Once | INTRAMUSCULAR | Status: AC
  Administered 2023-04-05: 30 mg via INTRAMUSCULAR

## 2023-04-05 MED ORDER — PREDNISONE 20 MG PO TABS: 20.0000 mg | ORAL_TABLET | Freq: Every day | ORAL | 0 refills | Status: DC

## 2023-04-05 MED ORDER — ONDANSETRON HCL 4 MG/2ML IJ SOLN
4.0000 mg | Freq: Once | INTRAMUSCULAR | Status: AC
  Administered 2023-04-05: 4 mg via INTRAMUSCULAR

## 2023-04-05 MED ORDER — ONDANSETRON 8 MG PO TBDP: 8.0000 mg | ORAL_TABLET | Freq: Once | ORAL | Status: DC

## 2023-04-05 NOTE — ED Triage Notes (Signed)
Patient c/o headache that started yesterday and reports right sided facial pain and pressure that started this morning.  Patient denies any other cold symptoms.  Patient denies fevers.

## 2023-04-05 NOTE — ED Provider Notes (Signed)
[facility]    CSN: [id number] Arrival date & time: 04/05/23  0806      History   Chief Complaint Chief Complaint  Patient presents with   Headache    HPI Emily Norris is a 37 y.o. female who presents with onser of HA since yesterday and pain is located on R side of face, and this am noticed pressure around her R orbit and she could not open her R eye fully. It is a little  better right now.  She denies any nose congestion or fever. She denies being sick at all in the past month with any respiratory symptoms. She denies vision changes. She is nauseous right now. She missed work and needs a note.     Past Medical History:  Diagnosis Date   Chest pressure 02/26/2017   MID-STERNAL   GERD (gastroesophageal reflux disease)    rare-tums prn   Migraines    Morbid obesity [facility])     Patient Active Problem List   Diagnosis Date Noted   Migraine with aura and without status migrainosus, not intractable 06/11/2018   Allergic rhinitis 01/19/2018   Morbid obesity with BMI of 50.0-59.9, adult (HCC) 03/03/2017   Nicotine vapor product user 08/02/2016   Bilateral carpal tunnel syndrome 09/29/2015   Numbness and tingling 09/21/2015   Anxiety 03/15/2015   Back pain 06/18/2012    Past Surgical History:  Procedure Laterality Date   CARPAL TUNNEL RELEASE Right 03/06/2017   Procedure: CARPAL TUNNEL RELEASE;  Surgeon: Kennedy Bucker, MD;  Location: [facility] ORS;  Service: Orthopedics;  Laterality: Right;   PLANTAR FASCIA SURGERY Right 2015    OB History   No obstetric history on file.      Home Medications    Prior to Admission medications   Medication Sig Start Date End Date Taking? Authorizing Provider  buPROPion (WELLBUTRIN XL) 300 MG 24 hr tablet Take 1 tablet by mouth every morning. 01/24/17  Yes [provider]  fexofenadine (ALLEGRA) 180 MG tablet Take by mouth. 01/04/17  Yes [provider]  predniSONE (DELTASONE) 20 MG tablet Take 1 tablet (20  mg total) by mouth daily with breakfast. 04/05/23  Yes Rodriguez-Southworth, Nettie Elm, PA-C  propranolol (INDERAL) 20 MG tablet Take by mouth. 07/12/19  Yes [provider]    Family History History reviewed. No pertinent family history.  Social History Social History   Tobacco Use   Smoking status: Never   Smokeless tobacco: Never  Vaping Use   Vaping status: Every Day  Substance Use Topics   Alcohol use: Yes    Comment: rare   Drug use: No     Allergies   Diclofenac, Imitrex [sumatriptan], Penicillins, Sulfa antibiotics, and Sulfasalazine   Review of Systems Review of Systems As noted in HPI  Physical Exam Triage Vital Signs ED Triage Vitals  Encounter Vitals Group     BP 04/05/23 0819 130/87     Systolic BP Percentile --      Diastolic BP Percentile --      Pulse Rate 04/05/23 0819 79     Resp 04/05/23 0819 14     Temp 04/05/23 0819 98.7 F (37.1 C)     Temp Source 04/05/23 0819 Oral     SpO2 04/05/23 0819 97 %     Weight 04/05/23 0816 (!) 325 lb (147.4 kg)     Height 04/05/23 0816 5\' 5"  (1.651 m)     Head Circumference --      Peak Flow --  Pain Score 04/05/23 0816 4     Pain Loc --      Pain Education --      Exclude from Growth Chart --    No data found.  Updated Vital Signs BP 130/87 (BP Location: Right Arm)   Pulse 79   Temp 98.7 F (37.1 C) (Oral)   Resp 14   Ht 5\' 5"  (1.651 m)   Wt (!) 325 lb (147.4 kg)   LMP 03/15/2023   SpO2 97%   BMI 54.08 kg/m   Visual Acuity Right Eye Distance:   Left Eye Distance:   Bilateral Distance:    Right Eye Near:   Left Eye Near:    Bilateral Near:     Physical Exam  Physical Exam Vitals signs and nursing note reviewed.  Constitutional:      General: SHe is not in acute distress.    Appearance: SHe is well-developed and normal weight. SHe is not ill-appearing, toxic-appearing or diaphoretic.  HENT:     Head: Normocephalic. TM's are clear bilaterally. Pharynx clear. Nose - normal. Has  tenderness on R sinus and mild on R ethmoid. All her sinuses are with good transillumination.  Eyes:     Extraocular Movements: Extraocular movements intact.     Pupils: Pupils are equal, round, and reactive to light.  Neck:     Musculoskeletal: Neck supple. No neck rigidity.  Cardiovascular:     Rate and Rhythm: Normal rate and regular rhythm.     Heart sounds: No murmur.  Pulmonary:     Effort: Pulmonary effort is normal.     Breath sounds: Normal breath sounds. No wheezing, rhonchi or rales.  Musculoskeletal: Normal range of motion.  Lymphadenopathy:     Cervical: No cervical adenopathy.  Skin:    General: Skin is warm and dry.  Neurological:     Mental Status: SHe is alert.     Cranial Nerves: No cranial nerve deficit or facial asymmetry.     Sensory: No sensory deficit.     Motor: No weakness.     Coordination: Romberg sign negative. Coordination normal.     Gait: Gait normal.     Deep Tendon Reflexes: Reflexes normal.     Comments: Normal Romberg,  finger to nose, tandem gait.   Psychiatric:        Mood and Affect: Mood normal.        Speech: Speech normal.        Behavior: Behavior normal.   UC Treatments / Results  Labs (all labs ordered are listed, but only abnormal results are displayed) Labs Reviewed - No data to display  EKG   Radiology No results found.  Procedures Procedures (including critical care time)  Medications Ordered in UC Medications  ketorolac (TORADOL) 30 MG/ML injection 30 mg (30 mg Intramuscular Given 04/05/23 0853)  ondansetron (ZOFRAN) injection 4 mg (4 mg Intramuscular Given 04/05/23 0854)    Initial Impression / Assessment and Plan / UC Course  I have reviewed the triage vital signs and the nursing notes.  Sinus frontal HA  She was given Zofran 8 mg and Toradol 30 mg as noted. Her pain went from 5/10, to 2/10  and nausea resolved.  I placed her on Prednisone 20 mg every day x 5 day for sinus inflammation.      Final Clinical  Impressions(s) / UC Diagnoses   Final diagnoses:  Sinus headache   Discharge Instructions   None    ED Prescriptions  Medication Sig Dispense Auth. Provider   predniSONE (DELTASONE) 20 MG tablet Take 1 tablet (20 mg total) by mouth daily with breakfast. 5 tablet Rodriguez-Southworth, Nettie Elm, PA-C      PDMP not reviewed this encounter.   Garey Ham, [place] 04/05/23 [phone]

## 2023-04-10 ENCOUNTER — Other Ambulatory Visit: Payer: Self-pay | Admitting: Physician Assistant

## 2023-04-10 DIAGNOSIS — G43109 Migraine with aura, not intractable, without status migrainosus: Secondary | ICD-10-CM

## 2023-04-10 DIAGNOSIS — R519 Headache, unspecified: Secondary | ICD-10-CM

## 2023-04-28 ENCOUNTER — Encounter: Payer: Self-pay | Admitting: Physician Assistant

## 2023-04-30 ENCOUNTER — Ambulatory Visit
Admission: RE | Admit: 2023-04-30 | Discharge: 2023-04-30 | Disposition: A | Discharge status: Home / Self Care | Payer: BC Managed Care – PPO | Source: Ambulatory Visit | Attending: Physician Assistant | Admitting: Physician Assistant

## 2023-04-30 DIAGNOSIS — R519 Headache, unspecified: Secondary | ICD-10-CM

## 2023-04-30 DIAGNOSIS — G43109 Migraine with aura, not intractable, without status migrainosus: Secondary | ICD-10-CM

## 2023-06-10 NOTE — Progress Notes (Unsigned)
Referring Physician:  Bridgette Habermann, PA-C [address] [place],  [place] [address]  Primary Physician:  Rolm Gala, MD  History of Present Illness: 06/11/2023 [name] is here today with a chief complaint of headaches.  Clinical evaluation requested for possible Chiari malformation.  She has approximately a 20-year history of chronic headaches.  She has been diagnosed with migraines and has been a migraine or with at least 15 migraines per month.  Often other days she will get rebound headaches.  She states that approximately 1 to 2 days out of the month she does not have any headaches.  She gets light sensitivity, nausea, photophobia, and phonophobia.  She states that sometimes she feels intense pressure behind her eye and can have difficulty keeping that eye open.  She does have worsening headaches when she coughs but she does not have any exacerbation or inciting of headaches with bearing down or Valsalva type maneuvers.  She gets no electrical-like shocks from her neck down her spine or up into her head.  She does sometimes have posterior headaches.  She states that she has had a recent eye exam but we do not have the full report to know whether or not a optic nerve exam was performed or not.  She does state that the "pressure was normal".  Unclear whether or not this was for glaucoma testing or a reflection of the nerve.  She states that all of her pain goes away whenever she gets high-dose anti-inflammatories such as Toradol.  Past Surgery: none  The symptoms are causing a significant impact on the patient's life.   I have utilized the care everywhere function in epic to review the outside records available from external health systems.  Review of Systems:  A 10 point review of systems is negative, except for the pertinent positives and negatives detailed in the HPI.  Past Medical History: Past Medical History:  Diagnosis Date   Chest pressure 02/26/2017    MID-STERNAL   GERD (gastroesophageal reflux disease)    rare-tums prn   Migraines    Morbid obesity (HCC)     Past Surgical History: Past Surgical History:  Procedure Laterality Date   CARPAL TUNNEL RELEASE Right 03/06/2017   Procedure: CARPAL TUNNEL RELEASE;  Surgeon: Kennedy Bucker, MD;  Location: [facility] ORS;  Service: Orthopedics;  Laterality: Right;   PLANTAR FASCIA SURGERY Right 2015    Allergies: Allergies as of 06/11/2023 - Review Complete 04/05/2023  Allergen Reaction Noted   Diclofenac Diarrhea 09/06/2013   Imitrex [sumatriptan] Nausea And Vomiting 02/27/2017   Penicillins Other (See Comments) 08/09/2013   Sulfa antibiotics Swelling and Rash 08/09/2013   Sulfasalazine Rash and Swelling 08/09/2013    Medications:  Current Outpatient Medications:    AIMOVIG 140 MG/ML SOAJ, Inject into the skin., Disp: , Rfl:    buPROPion (WELLBUTRIN XL) 300 MG 24 hr tablet, Take 1 tablet by mouth every morning., Disp: , Rfl: 11   fexofenadine (ALLEGRA) 180 MG tablet, Take by mouth., Disp: , Rfl:    propranolol (INDERAL) 20 MG tablet, Take by mouth., Disp: , Rfl:    Ubrogepant 100 MG TABS, Take by mouth., Disp: , Rfl:   Social History: Social History   Tobacco Use   Smoking status: Never   Smokeless tobacco: Never  Vaping Use   Vaping status: Every Day  Substance Use Topics   Alcohol use: Yes    Comment: rare   Drug use: No    Family Medical  History: History reviewed. No pertinent family history.  Physical Examination: Vitals:   06/11/23 0907  BP: 126/88    General: Patient is in no apparent distress. Attention to examination is appropriate.  Neck:   Supple.  Full range of motion.  Respiratory: Patient is breathing without any difficulty.   NEUROLOGICAL:     Awake, alert, oriented to person, place, and time.  Speech is clear and fluent.   Cranial Nerves: Pupils equal round and reactive to light.  Facial tone is symmetric. EOMI. Facial sensation is symmetric.  Shoulder shrug is symmetric. Tongue protrusion is midline.    Strength: Side Biceps Triceps Deltoid Interossei Grip Wrist Ext. Wrist Flex.  R 5 5 5 5 5 5 5   L 5 5 5 5 5 5 5    Side Iliopsoas Quads Hamstring PF DF EHL  R 5 5 5 5 5 5   L 5 5 5 5 5 5    Reflexes are 2+ and symmetric at the biceps, triceps, brachioradialis, patella and achilles.   Hoffman's is absent. Clonus is absent  Bilateral upper and lower extremity sensation is intact to light touch with exception of right CTS distribution.     No evidence of dysmetria noted.  Gait is normal.    Imaging: Narrative & Impression  CLINICAL DATA:  Provided history: Migraine with aura and without status migrainosus. Additional history provided: Worsening headaches. Facial numbness. Tingling into hand/arm.   EXAM: MRI HEAD WITHOUT CONTRAST   TECHNIQUE: Multiplanar, multiecho pulse sequences of the brain and surrounding structures were obtained without intravenous contrast.   COMPARISON:  Head CT 08/27/2012.   FINDINGS: Brain:   Cerebral volume is normal.   There are a few tiny nonspecific T2 FLAIR hyperintense chronic insults within the right frontal lobe white matter.   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 The cerebellar tonsils have a rounded appearance.   There is no acute infarct.   No evidence of an intracranial mass.   No chronic intracranial blood products.   No extra-axial fluid collection.   No midline shift.   Vascular: Maintained flow voids within the proximal large arterial vessels.   Skull and upper cervical spine: No focal suspicious marrow lesion.   Sinuses/Orbits: No mass or acute finding within the imaged orbits. Trace mucosal thickening within the anterior ethmoid air cells, bilaterally.   IMPRESSION: 1. No evidence of an acute intracranial abnormality. 2. There are a few tiny nonspecific T2 FLAIR hyperintense  chronic insults within the right frontal lobe white matter. 3. 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 Cerebellar tonsillar ectopia can also be seen in the setting of idiopathic intracranial hypertension (pseudotumor cerebri) or intracranial hypotension, but there are no secondary findings on this non-contrast examination to suggest these diagnosis.     Electronically Signed   By: Jackey Loge D.O.   On: 05/01/2023 19:27      I have personally reviewed the images and agree with the above interpretation.  Medical Decision Making/Assessment and Plan: Ms. Standiford is a pleasant 37 y.o. female with 20-year history of headaches.  She also has a diagnosis of constant migraines which she feels like hit her approximately 15-20 times a month.  Other days she often has rebound headaches and only feels that she  has maybe 1 to 2 days a month where she does not experience any headaches.  She was referred for a Chiari evaluation.  At this point she does not demonstrate significant symptoms such as tussive headaches, cranial neuropathies, or symptoms consistent with syringomyelia development.  On the MRI her tonsillar ectopia is borderline with rounded tonsils and no clear obstruction at the foramen magnum.  She does have a partial empty sella on her MRI, which can sometimes be seen in idiopathic intracranial hypertension.  She has recently seen an eye physician, however it is unclear whether or not she was tested for glaucoma or papilledema.  It would be helpful to know that she has had a true optic nerve exam as she was not completely clear on what "normal pressure" meant.  Given her age she is at risk for her IIH.  Thank you for involving me in the care of this patient.    Lovenia Kim MD/MSCR Neurosurgery

## 2023-06-11 ENCOUNTER — Encounter: Payer: Self-pay | Admitting: Neurosurgery

## 2023-06-11 ENCOUNTER — Ambulatory Visit (INDEPENDENT_AMBULATORY_CARE_PROVIDER_SITE_OTHER): Payer: BC Managed Care – PPO | Admitting: Neurosurgery

## 2023-06-11 VITALS — BP 126/88 | Ht 65.0 in | Wt 320.0 lb

## 2023-06-11 DIAGNOSIS — G935 Compression of brain: Secondary | ICD-10-CM | POA: Diagnosis not present

## 2023-06-11 DIAGNOSIS — G43109 Migraine with aura, not intractable, without status migrainosus: Secondary | ICD-10-CM | POA: Diagnosis not present

## 2023-06-11 DIAGNOSIS — Z6841 Body Mass Index (BMI) 40.0 and over, adult: Secondary | ICD-10-CM

## 2023-06-11 DIAGNOSIS — Q048 Other specified congenital malformations of brain: Secondary | ICD-10-CM | POA: Insufficient documentation
